# Patient Record
Sex: Female | Born: 1955 | Race: White | Hispanic: No | Smoking: Former smoker
Health system: Southern US, Community
[De-identification: ages and names within clinical notes are randomized; demographics above are authoritative.]

## PROBLEM LIST (undated history)

## (undated) DIAGNOSIS — F419 Anxiety disorder, unspecified: Secondary | ICD-10-CM

## (undated) DIAGNOSIS — I10 Essential (primary) hypertension: Secondary | ICD-10-CM

## (undated) HISTORY — PX: TONSILLECTOMY: SUR1361

## (undated) HISTORY — DX: Essential (primary) hypertension: I10

## (undated) HISTORY — DX: Anxiety disorder, unspecified: F41.9

---

## 2009-02-21 LAB — CBC AND DIFFERENTIAL
HCT: 42 (ref 36–46)
Hemoglobin: 15 (ref 12.0–16.0)
Platelets: 315 10*3/uL (ref 150–400)
WBC: 8.2

## 2009-02-21 LAB — CBC: RBC: 4.94 (ref 3.87–5.11)

## 2009-03-03 LAB — LIPID PANEL
Cholesterol: 197 (ref 0–200)
HDL: 4 — AB (ref 35–70)
LDL Cholesterol: 120
Triglycerides: 177 — AB (ref 40–160)

## 2019-12-26 ENCOUNTER — Telehealth: Payer: Self-pay | Admitting: *Deleted

## 2019-12-26 DIAGNOSIS — Z87891 Personal history of nicotine dependence: Secondary | ICD-10-CM

## 2019-12-26 LAB — BASIC METABOLIC PANEL
BUN: 22 — AB (ref 4–21)
CO2: 25 — AB (ref 13–22)
Chloride: 100 (ref 99–108)
Creatinine: 1 (ref 0.5–1.1)
Glucose: 84
Potassium: 3.3 mEq/L — AB (ref 3.5–5.1)
Sodium: 142 (ref 137–147)

## 2019-12-26 LAB — COMPREHENSIVE METABOLIC PANEL
Calcium: 9.5 (ref 8.7–10.7)
eGFR: 62

## 2019-12-26 LAB — HEMOGLOBIN A1C: Hemoglobin A1C: 5.7

## 2019-12-26 NOTE — Telephone Encounter (Signed)
Received referral for initial lung cancer screening scan. Contacted patient and obtained smoking history,(former, quit < 1 year ago, 35 pack year) as well as answering questions related to screening process. Patient denies signs of lung cancer such as weight loss or hemoptysis. Patient denies comorbidity that would prevent curative treatment if lung cancer were found. Patient is scheduled for shared decision making visit and CT scan on 01/02/20 at 145pm.

## 2020-01-02 ENCOUNTER — Encounter: Payer: Self-pay | Admitting: Oncology

## 2020-01-02 ENCOUNTER — Other Ambulatory Visit: Payer: Self-pay

## 2020-01-02 ENCOUNTER — Inpatient Hospital Stay: Payer: Medicare Other | Attending: Oncology | Admitting: Hospice and Palliative Medicine

## 2020-01-02 ENCOUNTER — Ambulatory Visit
Admission: RE | Admit: 2020-01-02 | Discharge: 2020-01-02 | Disposition: A | Discharge status: Home / Self Care | Payer: Medicare Other | Source: Ambulatory Visit | Attending: Oncology | Admitting: Oncology

## 2020-01-02 DIAGNOSIS — Z87891 Personal history of nicotine dependence: Secondary | ICD-10-CM | POA: Diagnosis present

## 2020-01-02 NOTE — Progress Notes (Signed)
In accordance with CMS guidelines, patient has met eligibility criteria including age, absence of signs or symptoms of lung cancer.  Social History   Tobacco Use  . Smoking status: Former Smoker    Packs/day: 1.00    Years: 35.00    Pack years: 35.00    Types: Cigarettes    Quit date: 11/08/2019    Years since quitting: 0.1  Substance Use Topics  . Alcohol use: Not on file  . Drug use: Not on file      A shared decision-making session was conducted prior to the performance of CT scan. This includes one or more decision aids, includes benefits and harms of screening, follow-up diagnostic testing, over-diagnosis, false positive rate, and total radiation exposure.   Counseling on the importance of adherence to annual lung cancer LDCT screening, impact of co-morbidities, and ability or willingness to undergo diagnosis and treatment is imperative for compliance of the program.   Counseling on the importance of continued smoking cessation for former smokers; the importance of smoking cessation for current smokers, and information about tobacco cessation interventions have been given to patient including [place] and 1800 quit [place] programs.   Written order for lung cancer screening with LDCT has been given to the patient and any and all questions have been answered to the best of my abilities.    Yearly follow up will be coordinated by Burgess Estelle, Thoracic Navigator.  Time Total: 15 minutes  Visit consisted of counseling and education dealing with complex health screening. Greater than 50%  of this time was spent counseling and coordinating care related to the above assessment and plan.  Signed by: Altha Harm, PhD, NP-C [phone] (Work Cell)

## 2020-01-03 ENCOUNTER — Telehealth: Payer: Self-pay | Admitting: *Deleted

## 2020-01-03 NOTE — Telephone Encounter (Signed)
Notified patient of LDCT lung cancer screening program results with recommendation for 12 month follow up imaging. Also notified of incidental findings noted below and is encouraged to discuss further with PCP who will receive a copy of this note and/or the CT report. Patient verbalizes understanding.   IMPRESSION: 1. Lung-RADS 1, negative. Continue annual screening with low-dose chest CT without contrast in 12 months. 2. 11 mm left adrenal nodule cannot be definitively characterized. 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 3. Cholelithiasis 4.  Emphysema. (ICD10-J43.9)

## 2020-12-11 ENCOUNTER — Telehealth: Payer: Self-pay | Admitting: *Deleted

## 2020-12-11 NOTE — Telephone Encounter (Signed)
Patient not ready to schedule please call back in a month.

## 2021-01-29 ENCOUNTER — Telehealth: Payer: Self-pay

## 2021-01-29 NOTE — Telephone Encounter (Signed)
Left message for patient to call back if she is ready to schedule lung cancer screening CT scan at [phone]

## 2021-03-24 ENCOUNTER — Telehealth: Payer: Self-pay | Admitting: *Deleted

## 2021-03-24 NOTE — Telephone Encounter (Signed)
Left another message for patient to notify them that it is time to schedule annual low dose lung cancer screening CT scan. Instructed patient to call back ([phone]) to verify information and schedule.  

## 2022-01-09 ENCOUNTER — Ambulatory Visit (INDEPENDENT_AMBULATORY_CARE_PROVIDER_SITE_OTHER): Payer: Medicare HMO | Admitting: Internal Medicine

## 2022-01-09 ENCOUNTER — Encounter: Payer: Self-pay | Admitting: Internal Medicine

## 2022-01-09 VITALS — BP 155/96 | HR 75 | Ht 65.0 in | Wt 159.0 lb

## 2022-01-09 DIAGNOSIS — E278 Other specified disorders of adrenal gland: Secondary | ICD-10-CM

## 2022-01-09 DIAGNOSIS — J3089 Other allergic rhinitis: Secondary | ICD-10-CM

## 2022-01-09 DIAGNOSIS — E279 Disorder of adrenal gland, unspecified: Secondary | ICD-10-CM

## 2022-01-09 DIAGNOSIS — I10 Essential (primary) hypertension: Secondary | ICD-10-CM

## 2022-01-09 DIAGNOSIS — F39 Unspecified mood [affective] disorder: Secondary | ICD-10-CM

## 2022-01-09 DIAGNOSIS — F17201 Nicotine dependence, unspecified, in remission: Secondary | ICD-10-CM | POA: Diagnosis not present

## 2022-01-09 DIAGNOSIS — N3281 Overactive bladder: Secondary | ICD-10-CM | POA: Insufficient documentation

## 2022-01-09 DIAGNOSIS — J432 Centrilobular emphysema: Secondary | ICD-10-CM

## 2022-01-09 MED ORDER — OXYBUTYNIN CHLORIDE ER 5 MG PO TB24: 5.0000 mg | ORAL_TABLET | Freq: Every day | ORAL | 1 refills | Status: DC

## 2022-01-09 MED ORDER — HYDROCHLOROTHIAZIDE 50 MG PO TABS: 50.0000 mg | ORAL_TABLET | Freq: Every day | ORAL | 1 refills | Status: DC

## 2022-01-09 NOTE — Progress Notes (Signed)
? ? ?Date:  01/09/2022  ? ?Name:  Taylor Khan   DOB:  03-31-1956   MRN:  [id number] ? ? ?Chief Complaint: Establish Care, Hypertension (Hasnt been on HCTZ for 1.5 years), and Emphysema (Has SOB, at night wheezing, rattling when breathing, had lung cancer screening 12/2019, allergies ) ? ?Hypertension ?This is a chronic problem. The problem is uncontrolled. Pertinent negatives include no chest pain, headaches or shortness of breath. Past treatments include diuretics (previously took HCTZ but none since 2020).  ?Cough ?This is a recurrent problem. The cough is Non-productive. Associated symptoms include myalgias (relieved by potassium) and postnasal drip. Pertinent negatives include no chest pain, chills, fever, headaches, rash, shortness of breath or wheezing. Risk factors: allergies with PND, rhinnorhea. Her past medical history is significant for emphysema and environmental allergies.  ? ?No results found for: NA, K, CO2, GLUCOSE, BUN, CREATININE, CALCIUM, EGFR, GFRNONAA, GLUCOSE ?No results found for: CHOL, HDL, LDLCALC, LDLDIRECT, TRIG, CHOLHDL ?No results found for: TSH ?No results found for: HGBA1C ?No results found for: WBC, HGB, HCT, MCV, PLT ?No results found for: ALT, AST, GGT, ALKPHOS, BILITOT ?No results found for: 25OHVITD2, [place], VD25OH  ? ?Review of Systems  ?Constitutional:  Negative for chills, fatigue, fever and unexpected weight change.  ?HENT:  Positive for postnasal drip.   ?Respiratory:  Positive for cough. Negative for chest tightness, shortness of breath and wheezing.   ?Cardiovascular:  Negative for chest pain and leg swelling.  ?Gastrointestinal:  Positive for constipation (relieved by Mg).  ?Genitourinary:  Positive for frequency and urgency.  ?Musculoskeletal:  Positive for myalgias (relieved by potassium).  ?Skin:  Negative for color change and rash.  ?Allergic/Immunologic: Positive for environmental allergies.  ?Neurological:  Negative for dizziness, light-headedness and headaches.   ?Psychiatric/Behavioral:  Positive for dysphoric mood. Negative for sleep disturbance. The patient is not nervous/anxious.   ? ?Patient Active Problem List  ? Diagnosis Date Noted  ? Essential hypertension 01/09/2022  ? Centrilobular emphysema ([place]) 01/09/2022  ? Adrenal nodule ([place]) 01/09/2022  ? Environmental and seasonal allergies 01/09/2022  ? OAB (overactive bladder) 01/09/2022  ? Tobacco use disorder, severe, in early remission 01/09/2022  ? ? ?Not on File ? ?Past Surgical History:  ?Procedure Laterality Date  ? TONSILLECTOMY    ? ? ?Social History  ? ?Tobacco Use  ? Smoking status: Former  ?  Packs/day: 1.00  ?  Years: 35.00  ?  Pack years: 35.00  ?  Types: Cigarettes  ?  Quit date: 11/08/2019  ?  Years since quitting: 2.1  ?Vaping Use  ? Vaping Use: Never used  ?Substance Use Topics  ? Alcohol use: Never  ? Drug use: Never  ? ? ? ?Medication list has been reviewed and updated. ? ?Current Meds  ?Medication Sig  ? Ascorbic Acid (VITAMIN C PO) Take by mouth.  ? hydrochlorothiazide (HYDRODIURIL) 50 MG tablet Take 1 tablet (50 mg total) by mouth daily.  ? MAGNESIUM PO Take by mouth.  ? oxybutynin (DITROPAN XL) 5 MG 24 hr tablet Take 1 tablet (5 mg total) by mouth at bedtime.  ? POTASSIUM PO Take by mouth.  ? VITAMIN D PO Take by mouth.  ? ? ? ?  01/09/2022  ?  2:58 PM  ?GAD 7 : Generalized Anxiety Score  ?Nervous, Anxious, on Edge 0  ?Control/stop worrying 0  ?Worry too much - different things 0  ?Trouble relaxing 0  ?Restless 0  ?Easily annoyed or irritable 0  ?Afraid - awful might  happen 0  ?Total GAD 7 Score 0  ? ? ? ?  01/09/2022  ?  2:57 PM  ?Depression screen PHQ 2/9  ?Decreased Interest 2  ?Down, Depressed, Hopeless 0  ?PHQ - 2 Score 2  ?Altered sleeping 3  ?Tired, decreased energy 3  ?Change in appetite 2  ?Feeling bad or failure about yourself  1  ?Trouble concentrating 0  ?Moving slowly or fidgety/restless 0  ?Suicidal thoughts 0  ?PHQ-9 Score 11  ?Difficult doing work/chores Somewhat difficult  ? ? ?BP  Readings from Last 3 Encounters:  ?01/09/22 (!) 155/96  ? ? ?Physical Exam ?Vitals and nursing note reviewed.  ?Constitutional:   ?   General: She is not in acute distress. ?   Appearance: Normal appearance. She is well-developed.  ?HENT:  ?   Head: Normocephalic and atraumatic.  ?Neck:  ?   Vascular: No carotid bruit.  ?Cardiovascular:  ?   Rate and Rhythm: Normal rate and regular rhythm.  ?Pulmonary:  ?   Effort: Pulmonary effort is normal. No respiratory distress.  ?   Breath sounds: No wheezing or rhonchi.  ?Musculoskeletal:  ?   Cervical back: Normal range of motion.  ?   Right lower leg: No edema.  ?   Left lower leg: No edema.  ?Lymphadenopathy:  ?   Cervical: No cervical adenopathy.  ?Skin: ?   General: Skin is warm and dry.  ?   Capillary Refill: Capillary refill takes less than 2 seconds.  ?   Findings: No rash.  ?Neurological:  ?   General: No focal deficit present.  ?   Mental Status: She is alert and oriented to person, place, and time.  ?Psychiatric:     ?   Mood and Affect: Mood normal.     ?   Behavior: Behavior normal.  ? ? ?Wt Readings from Last 3 Encounters:  ?01/09/22 159 lb (72.1 kg)  ?01/02/20 150 lb (68 kg)  ? ? ?BP (!) 155/96   Pulse 75   Ht 5' 5"  (1.651 m)   Wt 159 lb (72.1 kg)   SpO2 94%   BMI 26.46 kg/m?  ? ?Assessment and Plan: ?1. Essential hypertension ?Clinically stable exam with well controlled BP. ?Tolerating medications without side effects at this time. ?Pt to continue current regimen and low sodium diet; benefits of regular exercise as able discussed. ?- hydrochlorothiazide (HYDRODIURIL) 50 MG tablet; Take 1 tablet (50 mg total) by mouth daily.  Dispense: 90 tablet; Refill: 1 ? ?2. Centrilobular emphysema ([place]) ?Seen on CT - no ongoing shortness of breath, cough or wheeze ?She remains tobacco free ?- Ambulatory Referral Lung Cancer Screening [facility] ? ?3. Adrenal nodule ([place]) ?Seen on scan 2021 ?Needs MR to further characterize ?- MR Abdomen W Wo Contrast ? ?4.  Environmental and seasonal allergies ?Recommend Flonase NS ? ?5. OAB (overactive bladder) ?Significantly impacting her quality of life ?Will try Ditropan ?- oxybutynin (DITROPAN XL) 5 MG 24 hr tablet; Take 1 tablet (5 mg total) by mouth at bedtime.  Dispense: 90 tablet; Refill: 1 ? ?6. Tobacco use disorder, severe, in early remission ?Remains tobacco free ? ?7. Mood disorder ([place]) ?She does not feel that she is impaired - she has taken medications in the past but does not want them at this time. ?She has improved since her mother's passing as she is no longer stressed with her care ?Will discuss again at next visit and review symptom course ? ?Will request immunization and lab records  from Dr. Hall Busing. ?Partially dictated using Editor, commissioning. Any errors are unintentional. ? ?Halina Maidens, MD ?[facility] ?[facility] ? ?01/09/2022 ? ? ? ? ? ?

## 2022-01-21 ENCOUNTER — Ambulatory Visit: Payer: Medicare HMO

## 2022-01-26 ENCOUNTER — Ambulatory Visit
Admission: RE | Admit: 2022-01-26 | Discharge: 2022-01-26 | Disposition: A | Discharge status: Home / Self Care | Payer: Medicare HMO | Source: Ambulatory Visit | Attending: Internal Medicine | Admitting: Internal Medicine

## 2022-01-26 DIAGNOSIS — K449 Diaphragmatic hernia without obstruction or gangrene: Secondary | ICD-10-CM | POA: Diagnosis not present

## 2022-01-26 DIAGNOSIS — E279 Disorder of adrenal gland, unspecified: Secondary | ICD-10-CM | POA: Diagnosis not present

## 2022-01-26 DIAGNOSIS — E278 Other specified disorders of adrenal gland: Secondary | ICD-10-CM | POA: Insufficient documentation

## 2022-01-26 DIAGNOSIS — K8020 Calculus of gallbladder without cholecystitis without obstruction: Secondary | ICD-10-CM | POA: Diagnosis not present

## 2022-01-26 MED ORDER — GADOBUTROL 1 MMOL/ML IV SOLN
7.0000 mL | Freq: Once | INTRAVENOUS | Status: AC | PRN
  Administered 2022-01-26: 7 mL via INTRAVENOUS

## 2022-02-10 ENCOUNTER — Ambulatory Visit (INDEPENDENT_AMBULATORY_CARE_PROVIDER_SITE_OTHER): Payer: Medicare HMO | Admitting: Internal Medicine

## 2022-02-10 ENCOUNTER — Encounter: Payer: Self-pay | Admitting: Internal Medicine

## 2022-02-10 VITALS — BP 132/80 | HR 76 | Ht 65.0 in | Wt 153.0 lb

## 2022-02-10 DIAGNOSIS — I10 Essential (primary) hypertension: Secondary | ICD-10-CM | POA: Diagnosis not present

## 2022-02-10 DIAGNOSIS — F17201 Nicotine dependence, unspecified, in remission: Secondary | ICD-10-CM

## 2022-02-10 DIAGNOSIS — E782 Mixed hyperlipidemia: Secondary | ICD-10-CM | POA: Diagnosis not present

## 2022-02-10 DIAGNOSIS — E278 Other specified disorders of adrenal gland: Secondary | ICD-10-CM

## 2022-02-10 DIAGNOSIS — J432 Centrilobular emphysema: Secondary | ICD-10-CM

## 2022-02-10 DIAGNOSIS — N3281 Overactive bladder: Secondary | ICD-10-CM | POA: Diagnosis not present

## 2022-02-10 NOTE — Progress Notes (Signed)
Date:  02/10/2022   Name:  Taylor Khan   DOB:  [birth year]   MRN:  [id number]   Chief Complaint: Hypertension  Hypertension This is a chronic problem. The current episode started more than 1 year ago. The problem has been gradually improving since onset. The problem is controlled. Pertinent negatives include no chest pain, headaches, palpitations or shortness of breath. Past treatments include diuretics. The current treatment provides significant improvement.  OAB - much improved with Ditropan and no side effects.  Lab Results  Component Value Date   NA 142 12/26/2019   K 3.3 (A) 12/26/2019   CO2 25 (A) 12/26/2019   BUN 22 (A) 12/26/2019   CREATININE 1.0 12/26/2019   CALCIUM 9.5 12/26/2019   EGFR 62 12/26/2019   Lab Results  Component Value Date   CHOL 197 03/03/2009   HDL 4 (A) 03/03/2009   LDLCALC 120 03/03/2009   TRIG 177 (A) 03/03/2009   No results found for: TSH Lab Results  Component Value Date   HGBA1C 5.7 12/26/2019   Lab Results  Component Value Date   WBC 8.2 02/21/2009   HGB 15.0 02/21/2009   HCT 42 02/21/2009   PLT 315 02/21/2009   No results found for: ALT, AST, GGT, ALKPHOS, BILITOT No results found for: 25OHVITD2, 25OHVITD3, VD25OH   Review of Systems  Constitutional:  Negative for chills, fatigue and fever.  HENT:  Positive for postnasal drip. Negative for trouble swallowing.   Eyes:  Negative for visual disturbance.  Respiratory:  Positive for cough. Negative for chest tightness, shortness of breath and wheezing.   Cardiovascular:  Negative for chest pain, palpitations and leg swelling.  Musculoskeletal:  Negative for arthralgias and gait problem.  Skin:  Negative for rash.  Allergic/Immunologic: Positive for environmental allergies.  Neurological:  Negative for dizziness, light-headedness and headaches.  Psychiatric/Behavioral:  Negative for dysphoric mood and sleep disturbance. The patient is not nervous/anxious.    Patient Active Problem  List   Diagnosis Date Noted   Essential hypertension 01/09/2022   Centrilobular emphysema ([place]) 01/09/2022   Adrenal nodule ([place]) 01/09/2022   Environmental and seasonal allergies 01/09/2022   OAB (overactive bladder) 01/09/2022   Tobacco use disorder, severe, in early remission 01/09/2022    Not on File  Past Surgical History:  Procedure Laterality Date   TONSILLECTOMY      Social History   Tobacco Use   Smoking status: Former    Packs/day: 1.00    Years: 35.00    Pack years: 35.00    Types: Cigarettes    Quit date: 11/08/2019    Years since quitting: 2.2  Vaping Use   Vaping Use: Never used  Substance Use Topics   Alcohol use: Never   Drug use: Never     Medication list has been reviewed and updated.  Current Meds  Medication Sig   Ascorbic Acid (VITAMIN C PO) Take by mouth.   hydrochlorothiazide (HYDRODIURIL) 50 MG tablet Take 1 tablet (50 mg total) by mouth daily.   MAGNESIUM PO Take by mouth.   oxybutynin (DITROPAN XL) 5 MG 24 hr tablet Take 1 tablet (5 mg total) by mouth at bedtime.   POTASSIUM PO Take by mouth.   VITAMIN D PO Take by mouth.       02/10/2022    2:46 PM 01/09/2022    2:58 PM  GAD 7 : Generalized Anxiety Score  Nervous, Anxious, on Edge 0 0  Control/stop worrying 0 0  Worry too  much - different things 0 0  Trouble relaxing 0 0  Restless 0 0  Easily annoyed or irritable 0 0  Afraid - awful might happen 0 0  Total GAD 7 Score 0 0  Anxiety Difficulty Not difficult at all        02/10/2022    2:46 PM  Depression screen PHQ 2/9  Decreased Interest 2  Down, Depressed, Hopeless 0  PHQ - 2 Score 2  Altered sleeping 1  Tired, decreased energy 1  Change in appetite 1  Feeling bad or failure about yourself  1  Trouble concentrating 2  Moving slowly or fidgety/restless 0  Suicidal thoughts 0  PHQ-9 Score 8  Difficult doing work/chores Not difficult at all    BP Readings from Last 3 Encounters:  02/10/22 132/80  01/09/22 (!)  155/96    Physical Exam Vitals and nursing note reviewed.  Constitutional:      General: She is not in acute distress.    Appearance: Normal appearance. She is well-developed.  HENT:     Head: Normocephalic and atraumatic.  Neck:     Vascular: No carotid bruit.  Cardiovascular:     Rate and Rhythm: Normal rate and regular rhythm.     Pulses: Normal pulses.     Heart sounds: No murmur heard. Pulmonary:     Effort: Pulmonary effort is normal. No respiratory distress.     Breath sounds: No wheezing or rhonchi.  Musculoskeletal:     Cervical back: Normal range of motion.     Right lower leg: No edema.     Left lower leg: No edema.  Lymphadenopathy:     Cervical: No cervical adenopathy.  Skin:    General: Skin is warm and dry.     Findings: No rash.  Neurological:     General: No focal deficit present.     Mental Status: She is alert and oriented to person, place, and time.  Psychiatric:        Mood and Affect: Mood normal.        Behavior: Behavior normal.    Wt Readings from Last 3 Encounters:  02/10/22 153 lb (69.4 kg)  01/09/22 159 lb (72.1 kg)  01/02/20 150 lb (68 kg)    BP 132/80   Pulse 76   Ht 5' 5"  (1.651 m)   Wt 153 lb (69.4 kg)   SpO2 94%   BMI 25.46 kg/m   Assessment and Plan: 1. Essential hypertension Clinically stable exam with well controlled BP since adding HCTZ Tolerating medications without side effects at this time. Pt to continue current regimen and low sodium diet; benefits of regular exercise as able discussed. Follow up in 6 months - CBC with Differential/Platelet - Comprehensive metabolic panel - TSH  2. Centrilobular emphysema ([place]) Referred to [place] for CT but has not been contacted Will send new referral.  3. Adrenal nodule ([place]) MRI showed a stable, benign nodule - no further follow up is needed  4. OAB (overactive bladder) Doing well Ditropan  5. Mixed hyperlipidemia Check labs. - Lipid panel   Partially dictated  using Editor, commissioning. Any errors are unintentional.  Halina Maidens, MD [facility]  02/10/2022

## 2022-02-11 ENCOUNTER — Other Ambulatory Visit: Payer: Self-pay | Admitting: Internal Medicine

## 2022-02-11 DIAGNOSIS — E876 Hypokalemia: Secondary | ICD-10-CM

## 2022-02-11 LAB — COMPREHENSIVE METABOLIC PANEL
ALT: 17 IU/L (ref 0–32)
AST: 23 IU/L (ref 0–40)
Albumin/Globulin Ratio: 1.8 (ref 1.2–2.2)
Albumin: 4.8 g/dL (ref 3.8–4.8)
Alkaline Phosphatase: 136 IU/L — ABNORMAL HIGH (ref 44–121)
BUN/Creatinine Ratio: 25 (ref 12–28)
BUN: 24 mg/dL (ref 8–27)
Bilirubin Total: 0.3 mg/dL (ref 0.0–1.2)
CO2: 29 mmol/L (ref 20–29)
Calcium: 9.9 mg/dL (ref 8.7–10.3)
Chloride: 93 mmol/L — ABNORMAL LOW (ref 96–106)
Creatinine, Ser: 0.96 mg/dL (ref 0.57–1.00)
Globulin, Total: 2.7 g/dL (ref 1.5–4.5)
Glucose: 92 mg/dL (ref 70–99)
Potassium: 2.8 mmol/L — ABNORMAL LOW (ref 3.5–5.2)
Sodium: 140 mmol/L (ref 134–144)
Total Protein: 7.5 g/dL (ref 6.0–8.5)
eGFR: 65 mL/min/{1.73_m2} (ref >59)

## 2022-02-11 LAB — LIPID PANEL
Chol/HDL Ratio: 3.4 ratio (ref 0.0–4.4)
Cholesterol, Total: 208 mg/dL — ABNORMAL HIGH (ref 100–199)
HDL: 62 mg/dL (ref >39)
LDL Chol Calc (NIH): 100 mg/dL — ABNORMAL HIGH (ref 0–99)
Triglycerides: 276 mg/dL — ABNORMAL HIGH (ref 0–149)
VLDL Cholesterol Cal: 46 mg/dL — ABNORMAL HIGH (ref 5–40)

## 2022-02-11 LAB — CBC WITH DIFFERENTIAL/PLATELET
Basophils Absolute: 0.1 10*3/uL (ref 0.0–0.2)
Basos: 1 %
EOS (ABSOLUTE): 0.3 10*3/uL (ref 0.0–0.4)
Eos: 4 %
Hematocrit: 46.2 % (ref 34.0–46.6)
Hemoglobin: 15.8 g/dL (ref 11.1–15.9)
Immature Grans (Abs): 0 10*3/uL (ref 0.0–0.1)
Immature Granulocytes: 0 %
Lymphocytes Absolute: 2.6 10*3/uL (ref 0.7–3.1)
Lymphs: 33 %
MCH: 28.7 pg (ref 26.6–33.0)
MCHC: 34.2 g/dL (ref 31.5–35.7)
MCV: 84 fL (ref 79–97)
Monocytes Absolute: 0.6 10*3/uL (ref 0.1–0.9)
Monocytes: 8 %
Neutrophils Absolute: 4.4 10*3/uL (ref 1.4–7.0)
Neutrophils: 54 %
Platelets: 360 10*3/uL (ref 150–450)
RBC: 5.5 x10E6/uL — ABNORMAL HIGH (ref 3.77–5.28)
RDW: 13.6 % (ref 11.7–15.4)
WBC: 7.9 10*3/uL (ref 3.4–10.8)

## 2022-02-11 LAB — TSH: TSH: 2.75 u[IU]/mL (ref 0.450–4.500)

## 2022-02-11 MED ORDER — POTASSIUM CHLORIDE CRYS ER 20 MEQ PO TBCR: 20.0000 meq | EXTENDED_RELEASE_TABLET | Freq: Every day | ORAL | 3 refills | Status: DC

## 2022-02-12 ENCOUNTER — Telehealth: Payer: Self-pay | Admitting: Acute Care

## 2022-02-12 NOTE — Telephone Encounter (Signed)
Spoke with patient to schedule 2023 LDCT.  Patient has to have family member drive her.  She will call back to schedule after she talks with her daughter.  Given call back number

## 2022-03-31 ENCOUNTER — Telehealth: Payer: Self-pay | Admitting: Internal Medicine

## 2022-03-31 NOTE — Telephone Encounter (Signed)
Copied from CRM [id number]. Topic: Medicare AWV >> Mar 31, 2022 11:09 AM Zannie Kehr wrote: Reason for CRM: Left message for patient to call back and schedule Medicare Annual Wellness Visit (AWV) in office.   If unable to come into the office for AWV,  please offer to do virtually or by telephone.  No hx of AWV eligible for AWVI per [facility] as of 04/14/2020  Please schedule at anytime with [facility] Advisor.      45 minute appointment   Any questions, please call me at [phone]

## 2022-04-08 ENCOUNTER — Ambulatory Visit (INDEPENDENT_AMBULATORY_CARE_PROVIDER_SITE_OTHER): Payer: Medicare HMO

## 2022-04-08 DIAGNOSIS — Z0000 Encounter for general adult medical examination without abnormal findings: Secondary | ICD-10-CM | POA: Diagnosis not present

## 2022-04-08 NOTE — Progress Notes (Signed)
I connected with  Taylor Khan on 04/08/22 by a audio enabled telemedicine application and verified that I am speaking with the correct person using two identifiers.  Patient Location: Home  Provider Location: Home Office  I discussed the limitations of evaluation and management by telemedicine. The patient expressed understanding and agreed to proceed.   Subjective:   Taylor Khan is a 66 y.o. female who presents for Medicare Annual (Subsequent) preventive examination.  Review of Systems    Per HPI unless specifically indicated below  Cardiac Risk Factors include: advanced age (>86men, >41 women);female gender          Objective:    Today's Vitals   04/08/22 1357  PainSc: 0-No pain   There is no height or weight on file to calculate BMI.      No data to display          Current Medications (verified) Outpatient Encounter Medications as of 04/08/2022  Medication Sig   hydrochlorothiazide (HYDRODIURIL) 50 MG tablet Take 1 tablet (50 mg total) by mouth daily.   MAGNESIUM PO Take by mouth.   oxybutynin (DITROPAN XL) 5 MG 24 hr tablet Take 1 tablet (5 mg total) by mouth at bedtime.   potassium chloride SA (KLOR-CON M) 20 MEQ tablet Take 1 tablet (20 mEq total) by mouth daily.   triamcinolone (NASACORT ALLERGY 24HR) 55 MCG/ACT AERO nasal inhaler Place 2 sprays into the nose daily.   VITAMIN D PO Take by mouth.   Ascorbic Acid (VITAMIN C PO) Take by mouth. (Patient not taking: Reported on 04/08/2022)   No facility-administered encounter medications on file as of 04/08/2022.    Allergies (verified) Patient has no known allergies.   History: Past Medical History:  Diagnosis Date   Anxiety    Hypertension    Past Surgical History:  Procedure Laterality Date   TONSILLECTOMY     Family History  Problem Relation Age of Onset   Hypertension Mother    Heart disease Mother    Stroke Father    Heart disease Father    Hypertension Father    Diabetes Paternal  Grandfather    Social History   Socioeconomic History   Marital status: Single    Spouse name: Not on file   Number of children: 1   Years of education: Not on file   Highest education level: Not on file  Occupational History   Occupation: Retired  Tobacco Use   Smoking status: Former    Packs/day: 1.00    Years: 35.00    Total pack years: 35.00    Types: Cigarettes    Quit date: 11/08/2019    Years since quitting: 2.4   Smokeless tobacco: Not on file  Vaping Use   Vaping Use: Never used  Substance and Sexual Activity   Alcohol use: Never   Drug use: Never   Sexual activity: Not on file  Other Topics Concern   Not on file  Social History Narrative   Not on file   Social Determinants of Health   Financial Resource Strain: High Risk (04/08/2022)   Overall Financial Resource Strain (CARDIA)    Difficulty of Paying Living Expenses: Very hard  Food Insecurity: No Food Insecurity (04/08/2022)   Hunger Vital Sign    Worried About Running Out of Food in the Last Year: Never true    Ran Out of Food in the Last Year: Never true  Transportation Needs: No Transportation Needs (04/08/2022)   PRAPARE - Transportation  Lack of Transportation (Medical): No    Lack of Transportation (Non-Medical): No  Physical Activity: Not on file  Stress: No Stress Concern Present (04/08/2022)   [employer] of Occupational Health - Occupational Stress Questionnaire    Feeling of Stress : Not at all  Social Connections: Socially Isolated (04/08/2022)   Social Connection and Isolation Panel [NHANES]    Frequency of Communication with Friends and Family: More than three times a week    Frequency of Social Gatherings with Friends and Family: More than three times a week    Attends Religious Services: Never    Database administrator or Organizations: No    Attends Banker Meetings: Never    Marital Status: Never married    Tobacco Counseling Former smoker, quit smoking  11/08/2019   Clinical Intake:  Pre-visit preparation completed: No  Pain : 0-10 Pain Score: 0-No pain     Nutritional Status: BMI 25 -29 Overweight Nutritional Risks: None Diabetes: No  How often do you need to have someone help you when you read instructions, pamphlets, or other written materials from your doctor or pharmacy?: 1 - Never  Diabetic?No     Information entered by :: Laurel Dimmer, CMA   Activities of Daily Living    04/08/2022    1:50 PM 01/09/2022    2:58 PM  In your present state of health, do you have any difficulty performing the following activities:  Hearing? 0 1  Vision? 1 1  Comment last eye exam 2 yrs   Difficulty concentrating or making decisions? 0 1  Walking or climbing stairs? 1 1  Dressing or bathing? 0 0  Doing errands, shopping? 1 1    Patient Care Team: Reubin Milan, MD as PCP - General (Internal Medicine)  Indicate any recent Medical Services you may have received from other than [facility] providers in the past year (date may be approximate). No hospitalization in the past 12 months     Assessment:   This is a routine wellness examination for Taylor Khan.  Hearing/Vision screen Denies any hearing loss. Biannual eye exam - [facility] , recommended annual eye exams  Dietary issues and exercise activities discussed: Current Exercise Habits: The patient does not participate in regular exercise at present, Exercise limited by: Other - see comments   Goals Addressed   None    Depression Screen    04/08/2022    1:52 PM 02/10/2022    2:46 PM 01/09/2022    2:57 PM  PHQ 2/9 Scores  PHQ - 2 Score 2 2 2   PHQ- 9 Score 9 8 11     Fall Risk    04/08/2022    1:49 PM 02/10/2022    2:47 PM 01/09/2022    2:58 PM  Fall Risk   Falls in the past year? 0 0 0  Number falls in past yr: 0 0 0  Injury with Fall? 0 0 0  Risk for fall due to : No Fall Risks No Fall Risks No Fall Risks  Follow up Falls evaluation completed Falls evaluation completed  Falls evaluation completed    FALL RISK PREVENTION PERTAINING TO THE HOME:  Any stairs in or around the home? No  If so, are there any without handrails? No Home free of loose throw rugs in walkways, pet beds, electrical cords, etc? Yes  Adequate lighting in your home to reduce risk of falls? Yes   ASSISTIVE DEVICES UTILIZED TO PREVENT FALLS:  Life alert? No  Use  of a cane, walker or w/c? No  Grab bars in the bathroom? No  Shower chair or bench in shower? No  Elevated toilet seat or a handicapped toilet? No   TIMED UP AND GO:  Was the test performed? No .  Unable to perform because the appt was virtual.  Cognitive Function:        04/08/2022    1:55 PM  6CIT Screen  What Year? 0 points  What month? 0 points  What time? 0 points  Count back from 20 0 points  Months in reverse 0 points  Repeat phrase 0 points  Total Score 0 points    Immunizations  There is no immunization history on file for this patient.  TDAP status: Due, Education has been provided regarding the importance of this vaccine. Advised may receive this vaccine at local pharmacy or Health Dept. Aware to provide a copy of the vaccination record if obtained from local pharmacy or Health Dept. Verbalized acceptance and understanding.  Flu Vaccine status: Up to date  Pneumococcal vaccine status: Due, Education has been provided regarding the importance of this vaccine. Advised may receive this vaccine at local pharmacy or Health Dept. Aware to provide a copy of the vaccination record if obtained from local pharmacy or Health Dept. Verbalized acceptance and understanding.( The pt informed me that she received this vaccine at [facility]. I contacted [facility] and no vaccines was given to the the patient.  Covid-19 vaccine status: Declined, Education has been provided regarding the importance of this vaccine but patient still declined. Advised may receive this vaccine at local pharmacy or Health Dept.or  vaccine clinic. Aware to provide a copy of the vaccination record if obtained from local pharmacy or Health Dept. Verbalized acceptance and understanding.  Qualifies for Shingles Vaccine? Yes   Zostavax completed No   Shingrix Completed?: No.    Education has been provided regarding the importance of this vaccine. Patient has been advised to call insurance company to determine out of pocket expense if they have not yet received this vaccine. Advised may also receive vaccine at local pharmacy or Health Dept. Verbalized acceptance and understanding. ( The pt informed me that she received this vaccine at [facility]. I contacted [facility] and no vaccines was given to the the patient. I also checked [facility], no vaccines documented.)  Screening Tests Health Maintenance  Topic Date Due   COVID-19 Vaccine (1) Never done   Hepatitis C Screening  Never done   TETANUS/TDAP  Never done   Zoster Vaccines- Shingrix (1 of 2) Never done   Pneumonia Vaccine 85+ Years old (1 - PCV) 02/11/2023 (Originally 04/15/1962)   MAMMOGRAM  02/11/2023 (Originally 04/15/2004)   DEXA SCAN  02/11/2023 (Originally 04/16/2019)   COLONOSCOPY (Pts 45-28yrs Insurance coverage will need to be confirmed)  02/11/2023 (Originally 04/16/1999)   INFLUENZA VACCINE  04/14/2022   HPV VACCINES  Aged Out    Health Maintenance  Health Maintenance Due  Topic Date Due   COVID-19 Vaccine (1) Never done   Hepatitis C Screening  Never done   TETANUS/TDAP  Never done   Zoster Vaccines- Shingrix (1 of 2) Never done      Colorectal Exam: The pt declined    Mammogram: The pt declined   Dexa Scan: The pt declined   Lung Cancer Screening: (Low Dose CT Chest recommended if Age 52-80 years, 30 pack-year currently smoking OR have quit w/in 15years.) does not qualify.   Lung Cancer Screening Referral:  does not qualify  Additional Screening:  Hepatitis C Screening: does qualify; Postpone   Vision Screening: Recommended annual  ophthalmology exams for early detection of glaucoma and other disorders of the eye. Is the patient up to date with their annual eye exam?  No  Who is the provider or what is the name of the office in which the patient attends annual eye exams?  [facility] If pt is not established with a provider, would they like to be referred to a provider to establish care? No .   Dental Screening: Recommended annual dental exams for proper oral hygiene  Community Resource Referral / Chronic Care Management: CRR required this visit?  No   CCM required this visit?  No      Plan:     I have personally reviewed and noted the following in the patient's chart:   Medical and social history Use of alcohol, tobacco or illicit drugs  Current medications and supplements including opioid prescriptions.  Functional ability and status Nutritional status Physical activity Advanced directives List of other physicians Hospitalizations, surgeries, and ER visits in previous 12 months Vitals Screenings to include cognitive, depression, and falls Referrals and appointments  In addition, I have reviewed and discussed with patient certain preventive protocols, quality metrics, and best practice recommendations. A written personalized care plan for preventive services as well as general preventive health recommendations were provided to patient.     Ms. Apperson , Thank you for taking time to come for your Medicare Wellness Visit. I appreciate your ongoing commitment to your health goals. Please review the following plan we discussed and let me know if I can assist you in the future.   These are the goals we discussed:  Goals   None     This is a list of the screening recommended for you and due dates:  Health Maintenance  Topic Date Due   COVID-19 Vaccine (1) Never done   Hepatitis C Screening: USPSTF Recommendation to screen - Ages 22-79 yo.  Never done   Tetanus Vaccine  Never done   Zoster (Shingles)  Vaccine (1 of 2) Never done   Pneumonia Vaccine (1 - PCV) 02/11/2023*   Mammogram  02/11/2023*   DEXA scan (bone density measurement)  02/11/2023*   Colon Cancer Screening  02/11/2023*   Flu Shot  04/14/2022   HPV Vaccine  Aged Out  *Topic was postponed. The date shown is not the original due date.     Lonna Cobb, CMA   04/08/2022   Nurse Notes: Non-face-to-face AWV

## 2022-04-08 NOTE — Patient Instructions (Signed)
Bone Density Test A bone density test uses a type of X-ray to measure the amount of calcium and other minerals in a person's bones. It can measure bone density in the hip and the spine. The test is similar to having a regular X-ray. This test may also be called: Bone densitometry. Bone mineral density test. Dual-energy X-ray absorptiometry (DEXA). You may have this test to: Diagnose a condition that causes weak or thin bones (osteoporosis). Screen you for osteoporosis. Predict your risk for a broken bone (fracture). Determine how well your osteoporosis treatment is working. Tell a health care provider about: Any allergies you have. All medicines you are taking, including vitamins, herbs, eye drops, creams, and over-the-counter medicines. Any problems you or family members have had with anesthetic medicines. Any blood disorders you have. Any surgeries you have had. Any medical conditions you have. Whether you are pregnant or may be pregnant. Any medical tests you have had within the past 14 days that used contrast material. What are the risks? Generally, this is a safe test. However, it does expose you to a small amount of radiation, which can slightly increase your cancer risk. What happens before the test? Do not take any calcium supplements within the 24 hours before your test. You will need to remove all metal jewelry, eyeglasses, removable dental appliances, and any other metal objects on your body. What happens during the test?  You will lie down on an exam table. There will be an X-ray generator below you and an imaging device above you. Other devices, such as boxes or braces, may be used to position your body properly for the scan. The machine will slowly scan your body. You will need to keep very still while the machine does the scan. The images will show up on a screen in the room. Images will be examined by a specialist after your test is finished. The procedure may vary among  health care providers and hospitals. What can I expect after the test? It is up to you to get the results of your test. Ask your health care provider, or the department that is doing the test, when your results will be ready. Summary A bone density test is an imaging test that uses a type of X-ray to measure the amount of calcium and other minerals in your bones. The test may be used to diagnose or screen you for a condition that causes weak or thin bones (osteoporosis), predict your risk for a broken bone (fracture), or determine how well your osteoporosis treatment is working. Do not take any calcium supplements within 24 hours before your test. Ask your health care provider, or the department that is doing the test, when your results will be ready. This information is not intended to replace advice given to you by your health care provider. Make sure you discuss any questions you have with your health care provider. Document Revised: 05/14/2021 Document Reviewed: 02/15/2020 Elsevier Patient Education  [place].  Colonoscopy, Adult A colonoscopy is a procedure to look at the entire large intestine. This procedure is done using a long, thin, flexible tube that has a camera on the end. You may have a colonoscopy: As a part of normal colorectal screening. If you have certain symptoms, such as: A low number of red blood cells in your blood (anemia). Diarrhea that does not go away. Pain in your abdomen. Blood in your stool. A colonoscopy can help screen for and diagnose medical problems, including: An abnormal  growth of cells or tissue (tumor). Abnormal growths within the lining of your intestine (polyps). Inflammation. Areas of bleeding. Tell your health care provider about: Any allergies you have. All medicines you are taking, including vitamins, herbs, eye drops, creams, and over-the-counter medicines. Any problems you or family members have had with anesthetic medicines. Any  bleeding problems you have. Any surgeries you have had. Any medical conditions you have. Any problems you have had with having bowel movements. Whether you are pregnant or may be pregnant. What are the risks? Generally, this is a safe procedure. However, problems may occur, including: Bleeding. Damage to your intestine. Allergic reactions to medicines given during the procedure. Infection. This is rare. What happens before the procedure? Eating and drinking restrictions Follow instructions from your health care provider about eating or drinking restrictions, which may include: A few days before the procedure: Follow a low-fiber diet. Avoid nuts, seeds, dried fruit, raw fruits, and vegetables. 1-3 days before the procedure: Eat only gelatin dessert or ice pops. Drink only clear liquids, such as water, clear juice, clear broth or bouillon, black coffee or tea, or clear soft drinks or sports drinks. Avoid liquids that contain red or purple dye. The day of the procedure: Do not eat solid foods. You may continue to drink clear liquids until up to 2 hours before the procedure. Do not eat or drink anything starting 2 hours before the procedure, or within the time period that your health care provider recommends. Bowel prep If you were prescribed a bowel prep to take by mouth (orally) to clean out your colon: Take it as told by your health care provider. Starting the day before your procedure, you will need to drink a large amount of liquid medicine. The liquid will cause you to have many bowel movements of loose stool until your stool becomes almost clear or light green. If your skin or the opening between the buttocks (anus) gets irritated from diarrhea, you may relieve the irritation using: Wipes with medicine in them, such as adult wet wipes with aloe and vitamin E. A product to soothe skin, such as petroleum jelly. If you vomit while drinking the bowel prep: Take a break for up to 60  minutes. Begin the bowel prep again. Call your health care provider if you keep vomiting or you cannot take the bowel prep without vomiting. To clean out your colon, you may also be given: Laxative medicines. These help you have a bowel movement. Instructions for enema use. An enema is liquid medicine injected into your rectum. Medicines Ask your health care provider about: Changing or stopping your regular medicines or supplements. This is especially important if you are taking iron supplements, diabetes medicines, or blood thinners. Taking medicines such as aspirin and ibuprofen. These medicines can thin your blood. Do not take these medicines unless your health care provider tells you to take them. Taking over-the-counter medicines, vitamins, herbs, and supplements. General instructions Ask your health care provider what steps will be taken to help prevent infection. These may include washing skin with a germ-killing soap. If you will be going home right after the procedure, plan to have a responsible adult: Take you home from the hospital or clinic. You will not be allowed to drive. Care for you for the time you are told. What happens during the procedure?  An IV will be inserted into one of your veins. You will be given a medicine to make you fall asleep (general anesthetic). You will lie on  your side with your knees bent. A lubricant will be put on the tube. Then the tube will be: Inserted into your anus. Gently eased through all parts of your large intestine. Air will be sent into your colon to keep it open. This may cause some pressure or cramping. Images will be taken with the camera and will appear on a screen. A small tissue sample may be removed to be looked at under a microscope (biopsy). The tissue may be sent to a lab for testing if any signs of problems are found. If small polyps are found, they may be removed and checked for cancer cells. When the procedure is finished,  the tube will be removed. The procedure may vary among health care providers and hospitals. What happens after the procedure? Your blood pressure, heart rate, breathing rate, and blood oxygen level will be monitored until you leave the hospital or clinic. You may have a small amount of blood in your stool. You may pass gas and have mild cramping or bloating in your abdomen. This is caused by the air that was used to open your colon during the exam. If you were given a sedative during the procedure, it can affect you for several hours. Do not drive or operate machinery until your health care provider says that it is safe. It is up to you to get the results of your procedure. Ask your health care provider, or the department that is doing the procedure, when your results will be ready. Summary A colonoscopy is a procedure to look at the entire large intestine. Follow instructions from your health care provider about eating and drinking before the procedure. If you were prescribed an oral bowel prep to clean out your colon, take it as told by your health care provider. During the colonoscopy, a flexible tube with a camera on its end is inserted into the anus and then passed into all parts of the large intestine. This information is not intended to replace advice given to you by your health care provider. Make sure you discuss any questions you have with your health care provider. Document Revised: 08/25/2021 Document Reviewed: 04/23/2021 Elsevier Patient Education  2023 [employer].  Fall Prevention in the Home, Adult Falls can cause injuries and can happen to people of all ages. There are many things you can do to make your home safe and to help prevent falls. Ask for help when making these changes. What actions can I take to prevent falls? General Instructions Use good lighting in all rooms. Replace any light bulbs that burn out. Turn on the lights in dark areas. Use night-lights. Keep items  that you use often in easy-to-reach places. Lower the shelves around your home if needed. Set up your furniture so you have a clear path. Avoid moving your furniture around. Do not have throw rugs or other things on the floor that can make you trip. Avoid walking on wet floors. If any of your floors are uneven, fix them. Add color or contrast paint or tape to clearly mark and help you see: Grab bars or handrails. First and last steps of staircases. Where the edge of each step is. If you use a stepladder: Make sure that it is fully opened. Do not climb a closed stepladder. Make sure the sides of the stepladder are locked in place. Ask someone to hold the stepladder while you use it. Know where your pets are when moving through your home. What can I do in  the bathroom?     Keep the floor dry. Clean up any water on the floor right away. Remove soap buildup in the tub or shower. Use nonskid mats or decals on the floor of the tub or shower. Attach bath mats securely with double-sided, nonslip rug tape. If you need to sit down in the shower, use a plastic, nonslip stool. Install grab bars by the toilet and in the tub and shower. Do not use towel bars as grab bars. What can I do in the bedroom? Make sure that you have a light by your bed that is easy to reach. Do not use any sheets or blankets for your bed that hang to the floor. Have a firm chair with side arms that you can use for support when you get dressed. What can I do in the kitchen? Clean up any spills right away. If you need to reach something above you, use a step stool with a grab bar. Keep electrical cords out of the way. Do not use floor polish or wax that makes floors slippery. What can I do with my stairs? Do not leave any items on the stairs. Make sure that you have a light switch at the top and the bottom of the stairs. Make sure that there are handrails on both sides of the stairs. Fix handrails that are broken or  loose. Install nonslip stair treads on all your stairs. Avoid having throw rugs at the top or bottom of the stairs. Choose a carpet that does not hide the edge of the steps on the stairs. Check carpeting to make sure that it is firmly attached to the stairs. Fix carpet that is loose or worn. What can I do on the outside of my home? Use bright outdoor lighting. Fix the edges of walkways and driveways and fix any cracks. Remove anything that might make you trip as you walk through a door, such as a raised step or threshold. Trim any bushes or trees on paths to your home. Check to see if handrails are loose or broken and that both sides of all steps have handrails. Install guardrails along the edges of any raised decks and porches. Clear paths of anything that can make you trip, such as tools or rocks. Have leaves, snow, or ice cleared regularly. Use sand or salt on paths during winter. Clean up any spills in your garage right away. This includes grease or oil spills. What other actions can I take? Wear shoes that: Have a low heel. Do not wear high heels. Have rubber bottoms. Feel good on your feet and fit well. Are closed at the toe. Do not wear open-toe sandals. Use tools that help you move around if needed. These include: Canes. Walkers. Scooters. Crutches. Review your medicines with your doctor. Some medicines can make you feel dizzy. This can increase your chance of falling. Ask your doctor what else you can do to help prevent falls. Where to find more information Centers for Disease Control and Prevention, [employer]: [employer]Exhibition[employer].br [employer] on Aging: https://walker.com/ Contact a doctor if: You are afraid of falling at home. You feel weak, drowsy, or dizzy at home. You fall at home. Summary There are many simple things that you can do to make your home safe and to help prevent falls. Ways to make your home safe include removing things that can make you trip and installing  grab bars in the bathroom. Ask for help when making these changes in your home. This information  is not intended to replace advice given to you by your health care provider. Make sure you discuss any questions you have with your health care provider. Document Revised: 06/02/2021 Document Reviewed: 04/03/2020 Elsevier Patient Education  2023 [employer]  Health Maintenance, Female Adopting a healthy lifestyle and getting preventive care are important in promoting health and wellness. Ask your health care provider about: The right schedule for you to have regular tests and exams. Things you can do on your own to prevent diseases and keep yourself healthy. What should I know about diet, weight, and exercise? Eat a healthy diet  Eat a diet that includes plenty of vegetables, fruits, low-fat dairy products, and lean protein. Do not eat a lot of foods that are high in solid fats, added sugars, or sodium. Maintain a healthy weight Body mass index (BMI) is used to identify weight problems. It estimates body fat based on height and weight. Your health care provider can help determine your BMI and help you achieve or maintain a healthy weight. Get regular exercise Get regular exercise. This is one of the most important things you can do for your health. Most adults should: Exercise for at least 150 minutes each week. The exercise should increase your heart rate and make you sweat (moderate-intensity exercise). Do strengthening exercises at least twice a week. This is in addition to the moderate-intensity exercise. Spend less time sitting. Even light physical activity can be beneficial. Watch cholesterol and blood lipids Have your blood tested for lipids and cholesterol at 68 years of age, then have this test every 5 years. Have your cholesterol levels checked more often if: Your lipid or cholesterol levels are high. You are older than 68 years of age. You are at high risk for heart disease. What  should I know about cancer screening? Depending on your health history and family history, you may need to have cancer screening at various ages. This may include screening for: Breast cancer. Cervical cancer. Colorectal cancer. Skin cancer. Lung cancer. What should I know about heart disease, diabetes, and high blood pressure? Blood pressure and heart disease High blood pressure causes heart disease and increases the risk of stroke. This is more likely to develop in people who have high blood pressure readings or are overweight. Have your blood pressure checked: Every 3-5 years if you are 16-51 years of age. Every year if you are 73 years old or older. Diabetes Have regular diabetes screenings. This checks your fasting blood sugar level. Have the screening done: Once every three years after age 20 if you are at a normal weight and have a low risk for diabetes. More often and at a younger age if you are overweight or have a high risk for diabetes. What should I know about preventing infection? Hepatitis B If you have a higher risk for hepatitis B, you should be screened for this virus. Talk with your health care provider to find out if you are at risk for hepatitis B infection. Hepatitis C Testing is recommended for: Everyone born from [birth year] through 1967. Anyone with known risk factors for hepatitis C. Sexually transmitted infections (STIs) Get screened for STIs, including gonorrhea and chlamydia, if: You are sexually active and are younger than 68 years of age. You are older than 68 years of age and your health care provider tells you that you are at risk for this type of infection. Your sexual activity has changed since you were last screened, and you are at increased risk  for chlamydia or gonorrhea. Ask your health care provider if you are at risk. Ask your health care provider about whether you are at high risk for HIV. Your health care provider may recommend a prescription medicine  to help prevent HIV infection. If you choose to take medicine to prevent HIV, you should first get tested for HIV. You should then be tested every 3 months for as long as you are taking the medicine. Pregnancy If you are about to stop having your period (premenopausal) and you may become pregnant, seek counseling before you get pregnant. Take 400 to 800 micrograms (mcg) of folic acid every day if you become pregnant. Ask for birth control (contraception) if you want to prevent pregnancy. Osteoporosis and menopause Osteoporosis is a disease in which the bones lose minerals and strength with aging. This can result in bone fractures. If you are 65 years old or older, or if you are at risk for osteoporosis and fractures, ask your health care provider if you should: Be screened for bone loss. Take a calcium or vitamin D supplement to lower your risk of fractures. Be given hormone replacement therapy (HRT) to treat symptoms of menopause. Follow these instructions at home: Alcohol use Do not drink alcohol if: Your health care provider tells you not to drink. You are pregnant, may be pregnant, or are planning to become pregnant. If you drink alcohol: Limit how much you have to: 0-1 drink a day. Know how much alcohol is in your drink. In the [place] one drink equals one 12 oz bottle of beer (355 mL), one 5 oz glass of wine (148 mL), or one 1 oz glass of hard liquor (44 mL). Lifestyle Do not use any products that contain nicotine or tobacco. These products include cigarettes, chewing tobacco, and vaping devices, such as e-cigarettes. If you need help quitting, ask your health care provider. Do not use street drugs. Do not share needles. Ask your health care provider for help if you need support or information about quitting drugs. General instructions Schedule regular health, dental, and eye exams. Stay current with your vaccines. Tell your health care provider if: You often feel depressed. You  have ever been abused or do not feel safe at home. Summary Adopting a healthy lifestyle and getting preventive care are important in promoting health and wellness. Follow your health care provider's instructions about healthy diet, exercising, and getting tested or screened for diseases. Follow your health care provider's instructions on monitoring your cholesterol and blood pressure. This information is not intended to replace advice given to you by your health care provider. Make sure you discuss any questions you have with your health care provider. Document Revised: 01/20/2021 Document Reviewed: 01/20/2021 Elsevier Patient Education  2023 [employer]  Mammogram A mammogram is an X-ray of the breasts. This procedure can screen for and detect any changes that may indicate breast cancer. Mammograms are regularly done beginning at age 40 for women with average risk. A man may have a mammogram if he has a lump or swelling in his breast tissue. A mammogram can also identify other changes and variations in the breast, such as: Inflammation of the breast tissue (mastitis). An infected area that contains a collection of pus (abscess). A fluid-filled sac (cyst). Tumors that are not cancerous (benign). Fibrocystic changes. This is when breast tissue becomes denser and can make the tissue feel rope-like or uneven under the skin. Women at higher risk for breast cancer need earlier and more comprehensive screening   for abnormal changes. Breast tomosynthesis, or three-dimensional (3D) mammography, and digital breast tomosynthesis are advanced forms of imaging that create 3D pictures of the breasts. Tell a health care provider: About any allergies you have. If you have breast implants. If you have had previous breast disease, biopsy, or surgery. If you have a family history of breast cancer. If you are breastfeeding. Whether you are pregnant or may be pregnant. What are the risks? Generally, this is  a safe procedure. However, problems may occur, including: Exposure to radiation. Radiation levels are very low with this test. The need for more tests. The mammogram fails to detect certain cancers or the results are misinterpreted. Difficulty with detecting breast cancer in women with dense breasts. What happens before the procedure? Schedule your test about 1-2 weeks after your menstrual period if you are menstruating. This is usually when your breasts are the least tender. If you have had a mammogram done at a different facility in the past, get the mammogram X-rays or have them sent to your current exam facility. The new and old images will be compared. Wash your breasts and underarms on the day of the test. Do not wear deodorants, perfumes, lotions, or powders anywhere on your body on the day of the test. Remove any jewelry from your neck. Wear clothes that you can change into and out of easily. What happens during the procedure?  You will undress from the waist up and put on a gown that opens in the front. You will stand in front of the X-ray machine. Each breast will be placed between two plastic or glass plates. The plates will compress your breast for a few seconds. Try to stay as relaxed as possible. This procedure does not cause any harm to your breasts. Any discomfort you feel will be very brief. X-rays will be taken from different angles of each breast. The procedure may vary among health care providers and hospitals. What can I expect after the procedure? The mammogram will be examined by a specialist (radiologist). You may need to repeat certain parts of the test, depending on the quality of the images. This is done if the radiologist needs a better view of the breast tissue. You may resume your normal activities. It is up to you to get the results of your procedure. Ask your health care provider, or the department that is doing the procedure, when your results will be  ready. Summary A mammogram is an X-ray of the breasts. This procedure can screen for and detect any changes that may indicate breast cancer. A man may have a mammogram if he has a lump or swelling in his breast tissue. If you have had a mammogram done at a different facility in the past, get the mammogram X-rays or have them sent to your current exam facility in order to compare them. Schedule your test about 1-2 weeks after your menstrual period if you are menstruating. Ask when your test results will be ready. Make sure you get your test results. This information is not intended to replace advice given to you by your health care provider. Make sure you discuss any questions you have with your health care provider. Document Revised: 05/14/2021 Document Reviewed: 07/01/2020 Elsevier Patient Education  2023 [employer].

## 2022-07-03 ENCOUNTER — Other Ambulatory Visit: Payer: Self-pay | Admitting: Internal Medicine

## 2022-07-03 DIAGNOSIS — I10 Essential (primary) hypertension: Secondary | ICD-10-CM

## 2022-07-03 NOTE — Telephone Encounter (Signed)
Requested Prescriptions  Pending Prescriptions Disp Refills  . hydrochlorothiazide (HYDRODIURIL) 50 MG tablet [Pharmacy Med Name: HYDROCHLOROTHIAZIDE 50MG  TABLETS] 90 tablet 1    Sig: TAKE 1 TABLET(50 MG) BY MOUTH DAILY     Cardiovascular: Diuretics - Thiazide Failed - 07/03/2022  3:18 AM      Failed - K in normal range and within 180 days    Potassium  Date Value Ref Range Status  02/10/2022 2.8 (L) 3.5 - 5.2 mmol/L Final         Passed - Cr in normal range and within 180 days    Creatinine, Ser  Date Value Ref Range Status  02/10/2022 0.96 0.57 - 1.00 mg/dL Final         Passed - Na in normal range and within 180 days    Sodium  Date Value Ref Range Status  02/10/2022 140 134 - 144 mmol/L Final         Passed - Last BP in normal range    BP Readings from Last 1 Encounters:  02/10/22 132/80         Passed - Valid encounter within last 6 months    Recent Outpatient Visits          4 months ago Essential hypertension   [place] [facility] at [facility], Jesse Sans, MD   5 months ago Essential hypertension   [place] [facility] at [facility], Jesse Sans, MD      Future Appointments            In 1 month Army Melia, Jesse Sans, MD [place] and [facility] Medicine at [facility], [facility]

## 2022-08-14 ENCOUNTER — Encounter: Payer: Self-pay | Admitting: Internal Medicine

## 2022-08-14 ENCOUNTER — Ambulatory Visit (INDEPENDENT_AMBULATORY_CARE_PROVIDER_SITE_OTHER): Payer: Medicare HMO | Admitting: Internal Medicine

## 2022-08-14 VITALS — BP 118/80 | HR 69 | Ht 65.0 in | Wt 154.0 lb

## 2022-08-14 DIAGNOSIS — I10 Essential (primary) hypertension: Secondary | ICD-10-CM

## 2022-08-14 DIAGNOSIS — N3281 Overactive bladder: Secondary | ICD-10-CM

## 2022-08-14 DIAGNOSIS — R0781 Pleurodynia: Secondary | ICD-10-CM | POA: Diagnosis not present

## 2022-08-14 MED ORDER — OXYBUTYNIN CHLORIDE ER 5 MG PO TB24: 5.0000 mg | ORAL_TABLET | Freq: Every day | ORAL | 3 refills | Status: DC

## 2022-08-14 NOTE — Progress Notes (Signed)
Date:  08/14/2022   Name:  Taylor Khan   DOB:  Sep 12, 1956   MRN:  [id number]   Chief Complaint: Hypertension  Hypertension This is a chronic problem. The problem is controlled. Associated symptoms include chest pain. Pertinent negatives include no headaches, palpitations or shortness of breath. Past treatments include diuretics. The current treatment provides significant improvement.  Chest Pain  This is a new problem. Episode onset: fell against a table and hit her right lateral ribs on Thanksgiving. The onset quality is sudden. The problem has been waxing and waning. The pain is present in the lateral region. The pain is mild. The pain does not radiate. Pertinent negatives include no abdominal pain, cough, dizziness, headaches, palpitations, shortness of breath or weakness.  Her past medical history is significant for hypertension.   OAB - much benefit from ditropan.  Needs a refill.   Lab Results  Component Value Date   NA 140 02/10/2022   K 2.8 (L) 02/10/2022   CO2 29 02/10/2022   GLUCOSE 92 02/10/2022   BUN 24 02/10/2022   CREATININE 0.96 02/10/2022   CALCIUM 9.9 02/10/2022   EGFR 65 02/10/2022   Lab Results  Component Value Date   CHOL 208 (H) 02/10/2022   HDL 62 02/10/2022   LDLCALC 100 (H) 02/10/2022   TRIG 276 (H) 02/10/2022   CHOLHDL 3.4 02/10/2022   Lab Results  Component Value Date   TSH 2.750 02/10/2022   Lab Results  Component Value Date   HGBA1C 5.7 12/26/2019   Lab Results  Component Value Date   WBC 7.9 02/10/2022   HGB 15.8 02/10/2022   HCT 46.2 02/10/2022   MCV 84 02/10/2022   PLT 360 02/10/2022   Lab Results  Component Value Date   ALT 17 02/10/2022   AST 23 02/10/2022   ALKPHOS 136 (H) 02/10/2022   BILITOT 0.3 02/10/2022   No results found for: "25OHVITD2", "25OHVITD3", "VD25OH"   Review of Systems  Constitutional:  Negative for fatigue and unexpected weight change.  HENT:  Negative for nosebleeds.   Eyes:  Negative for visual  disturbance.  Respiratory:  Negative for cough, chest tightness, shortness of breath and wheezing.   Cardiovascular:  Positive for chest pain. Negative for palpitations and leg swelling.  Gastrointestinal:  Negative for abdominal pain, constipation and diarrhea.  Genitourinary:  Positive for frequency and urgency. Negative for dysuria.  Neurological:  Negative for dizziness, weakness, light-headedness and headaches.    Patient Active Problem List   Diagnosis Date Noted   Essential hypertension 01/09/2022   Centrilobular emphysema ([place]) 01/09/2022   Adrenal nodule ([place]) 01/09/2022   Environmental and seasonal allergies 01/09/2022   OAB (overactive bladder) 01/09/2022   Tobacco use disorder, severe, in early remission 01/09/2022    No Known Allergies  Past Surgical History:  Procedure Laterality Date   TONSILLECTOMY      Social History   Tobacco Use   Smoking status: Former    Packs/day: 1.00    Years: 35.00    Total pack years: 35.00    Types: Cigarettes    Quit date: 11/08/2019    Years since quitting: 2.7  Vaping Use   Vaping Use: Never used  Substance Use Topics   Alcohol use: Never   Drug use: Never     Medication list has been reviewed and updated.  Current Meds  Medication Sig   Ascorbic Acid (VITAMIN C PO) Take by mouth.   hydrochlorothiazide (HYDRODIURIL) 50 MG tablet TAKE 1  TABLET(50 MG) BY MOUTH DAILY   MAGNESIUM PO Take by mouth.   potassium chloride SA (KLOR-CON M) 20 MEQ tablet Take 1 tablet (20 mEq total) by mouth daily.   triamcinolone (NASACORT ALLERGY 24HR) 55 MCG/ACT AERO nasal inhaler Place 2 sprays into the nose daily.   VITAMIN D PO Take by mouth.   [DISCONTINUED] oxybutynin (DITROPAN XL) 5 MG 24 hr tablet Take 1 tablet (5 mg total) by mouth at bedtime.       08/14/2022    2:13 PM 02/10/2022    2:46 PM 01/09/2022    2:58 PM  GAD 7 : Generalized Anxiety Score  Nervous, Anxious, on Edge 0 0 0  Control/stop worrying 0 0 0  Worry too much -  different things 1 0 0  Trouble relaxing 0 0 0  Restless 0 0 0  Easily annoyed or irritable 0 0 0  Afraid - awful might happen 0 0 0  Total GAD 7 Score 1 0 0  Anxiety Difficulty Not difficult at all Not difficult at all        08/14/2022    2:12 PM 04/08/2022    1:52 PM 02/10/2022    2:46 PM  Depression screen PHQ 2/9  Decreased Interest _0 Down, Depressed, Hopeless 2 0 0  PHQ - 2 Score _1 Altered sleeping _2 Tired, decreased energy _3 Change in appetite 0 2 1  Feeling bad or failure about yourself  0 0 1  Trouble concentrating 1 0 2  Moving slowly or fidgety/restless 0 1 0  Suicidal thoughts 0 0 0  PHQ-9 Score _4 Difficult doing work/chores Somewhat difficult Somewhat difficult Not difficult at all    BP Readings from Last 3 Encounters:  08/14/22 118/80  02/10/22 132/80  01/09/22 (!) 155/96    Physical Exam Vitals and nursing note reviewed.  Constitutional:      General: She is not in acute distress.    Appearance: Normal appearance. She is well-developed.  HENT:     Head: Normocephalic and atraumatic.  Cardiovascular:     Rate and Rhythm: Normal rate and regular rhythm.  Pulmonary:     Effort: Pulmonary effort is normal. No respiratory distress.     Breath sounds: No wheezing or rhonchi.  Chest:       Comments: Tenderness with no bruising or deformity  Musculoskeletal:     Cervical back: Normal range of motion.  Skin:    General: Skin is warm and dry.     Findings: No rash.  Neurological:     Mental Status: She is alert and oriented to person, place, and time.  Psychiatric:        Mood and Affect: Mood normal.        Behavior: Behavior normal.     Wt Readings from Last 3 Encounters:  08/14/22 154 lb (69.9 kg)  02/10/22 153 lb (69.4 kg)  01/09/22 159 lb (72.1 kg)    BP 118/80 (BP Location: Right Arm, Cuff Size: Large)   Pulse 69   Ht _5  (1.651 m)   Wt 154 lb (69.9 kg)   SpO2 98%   BMI 25.63 kg/m   Assessment and  Plan: 1. Essential hypertension Clinically stable exam with well controlled BP. Tolerating medications without side effects at this time. Pt to continue current regimen and low sodium diet; benefits of regular exercise as able discussed.  2. OAB (overactive bladder)  Continue Ditropan daily - oxybutynin (DITROPAN XL) 5 MG 24 hr tablet; Take 1 tablet (5 mg total) by mouth at bedtime.  Dispense: 90 tablet; Refill: 3  3. Rib pain on right side No obvious deformity or bruising Pt is not in severe pain and would prefer watchful waiting Imaging of the ribs is therefore deferred - she can return at any time if symptoms worsen   Partially dictated using Dragon software. Any errors are unintentional.  Halina Maidens, MD [facility]  08/14/2022

## 2022-09-12 DIAGNOSIS — H524 Presbyopia: Secondary | ICD-10-CM | POA: Diagnosis not present

## 2022-09-24 DIAGNOSIS — H524 Presbyopia: Secondary | ICD-10-CM | POA: Diagnosis not present

## 2022-09-24 DIAGNOSIS — H52223 Regular astigmatism, bilateral: Secondary | ICD-10-CM | POA: Diagnosis not present

## 2022-09-29 ENCOUNTER — Other Ambulatory Visit: Payer: Self-pay | Admitting: Internal Medicine

## 2022-09-29 DIAGNOSIS — I10 Essential (primary) hypertension: Secondary | ICD-10-CM

## 2022-09-29 NOTE — Telephone Encounter (Signed)
Requested Prescriptions  Pending Prescriptions Disp Refills   hydrochlorothiazide (HYDRODIURIL) 50 MG tablet [Pharmacy Med Name: HYDROCHLOROTHIAZIDE 50MG  TABLETS] 90 tablet 0    Sig: TAKE 1 TABLET(50 MG) BY MOUTH DAILY     Cardiovascular: Diuretics - Thiazide Failed - 09/29/2022  3:19 AM      Failed - Cr in normal range and within 180 days    Creatinine, Ser  Date Value Ref Range Status  02/10/2022 0.96 0.57 - 1.00 mg/dL Final         Failed - K in normal range and within 180 days    Potassium  Date Value Ref Range Status  02/10/2022 2.8 (L) 3.5 - 5.2 mmol/L Final         Failed - Na in normal range and within 180 days    Sodium  Date Value Ref Range Status  02/10/2022 140 134 - 144 mmol/L Final         Passed - Last BP in normal range    BP Readings from Last 1 Encounters:  08/14/22 118/80         Passed - Valid encounter within last 6 months    Recent Outpatient Visits           1 month ago Rib pain on right side   [place] [facility] at [facility], Jesse Sans, MD   7 months ago Essential hypertension   [place] [facility] at [facility], Jesse Sans, MD   8 months ago Essential hypertension   [place] [facility] at [facility], Jesse Sans, MD       Future Appointments             In 4 months Army Melia, Jesse Sans, MD [facility] Medicine at [facility], [facility]

## 2022-12-28 IMAGING — MR MR ABDOMEN WO/W CM
18 series · 48 of 48 positions shown · IV contrast (7 ml Gadavist)
Comparison: CT chest 01/02/2020

CLINICAL DATA: Adrenal mass evaluation

EXAM:
MRI ABDOMEN WITHOUT AND WITH CONTRAST
TECHNIQUE: Multiplanar multisequence MR imaging of the abdomen was performed
both before and after the administration of intravenous contrast.
CONTRAST:  7mL GADAVIST GADOBUTROL 1 MMOL/ML IV SOLN

[Series 2: cor haste · coronal · 6.0mm · 1.19mm/px · 1 of 30 slices shown]
[im 1/30]
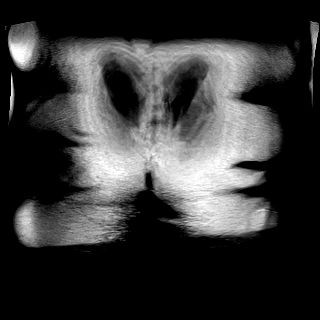

[Series 3: ax haste · axial · 6.0mm · 1.19mm/px · 1 of 30 slices shown]
[im 1/30]
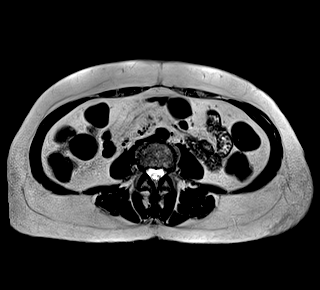

[Series 5: T2 fat-sat · axial · 6.0mm · 1.19mm/px · 1 of 37 slices shown]
[im 1/37]
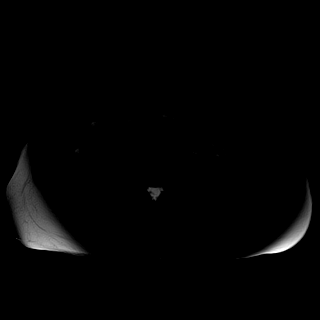

[Series 7: DWI · axial · 6.0mm · 1.42mm/px · z∈[-139,+120]mm · 4 of 111 slices shown (1 of 2)]
[im 1/111]
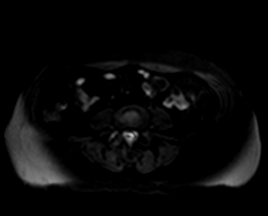
[im 37/111]
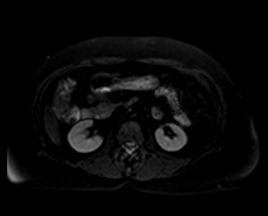
[im 74/111]
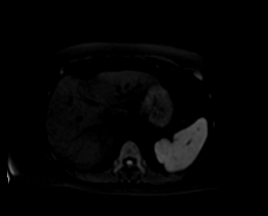
[im 111/111]
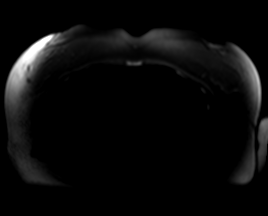

[Series 8: DWI · axial · 6.0mm · 1.42mm/px · 1 of 37 slices shown (2 of 2)]
[im 1/37]
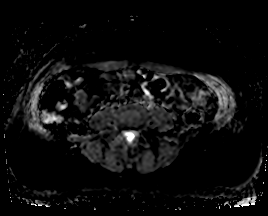

[Series 9: in & out · axial · 3.0mm · 1.19mm/px · z∈[-140,+121]mm · 3 of 88 slices shown (1 of 2)]
[im 1/88]
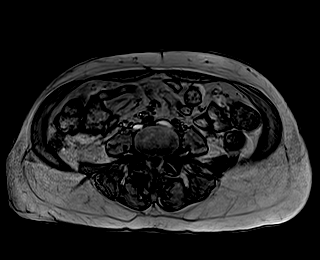
[im 44/88]
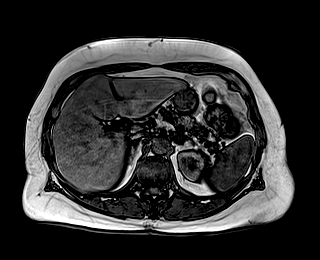
[im 88/88]
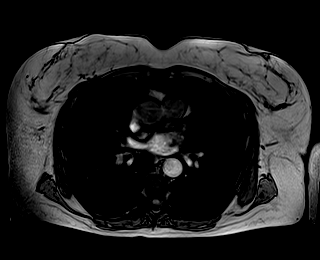

[Series 10: in & out · axial · 3.0mm · 1.19mm/px · z∈[-140,+121]mm · 3 of 88 slices shown (2 of 2)]
[im 1/88]
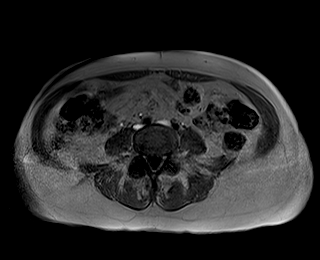
[im 44/88]
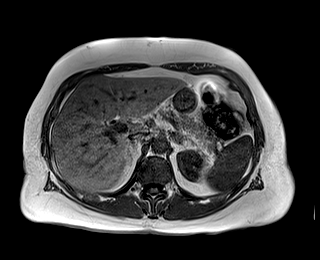
[im 88/88]
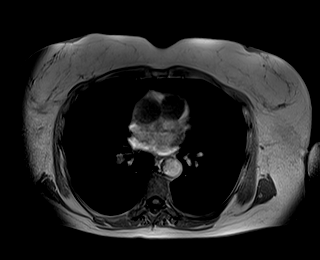

[Series 11: cor in-opp · coronal · 3.0mm · 1.19mm/px · 5 of 144 slices shown]
[im 1/144]
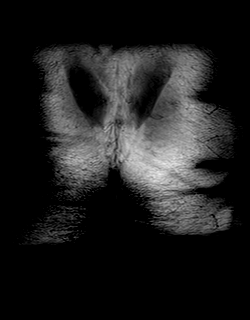
[im 36/144]
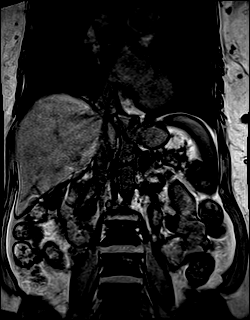
[im 72/144]
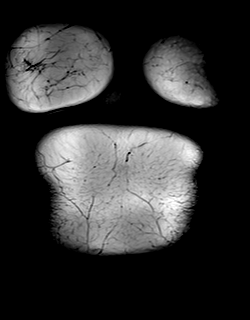
[im 108/144]
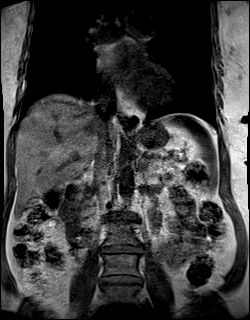
[im 144/144]
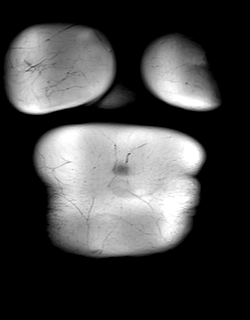

[Series 12: T1 dynamic fat-sat · axial · non-contrast · 3.0mm · 1.19mm/px · z∈[-140,+121]mm · 3 of 88 slices shown (1 of 5)]
[im 1/88]
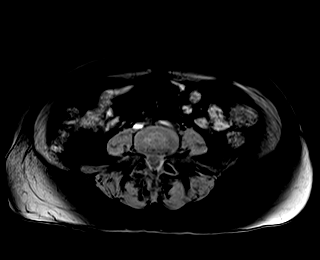
[im 44/88]
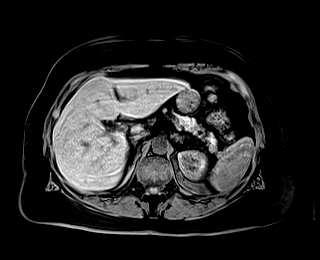
[im 88/88]
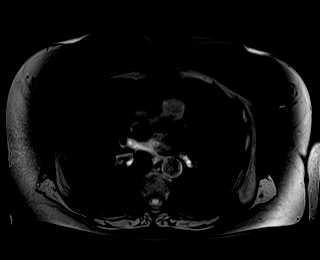

[Series 13: T1 dynamic fat-sat post-contrast · axial · 3.0mm · 1.19mm/px · z∈[-140,+121]mm · 3 of 88 slices shown (1 of 4)]
[im 1/88]
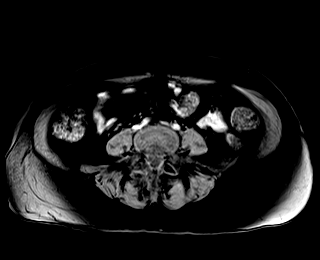
[im 44/88]
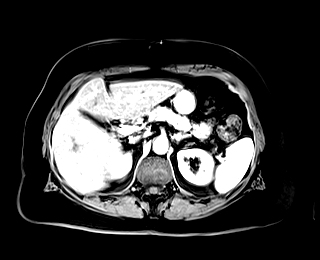
[im 88/88]
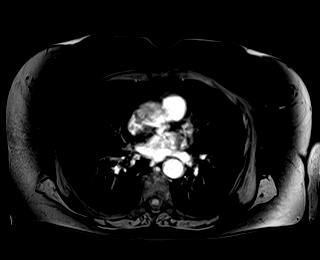

[Series 14: T1 dynamic fat-sat · axial · 3.0mm · 1.19mm/px · z∈[-140,+121]mm · 3 of 88 slices shown (2 of 5)]
[im 1/88]
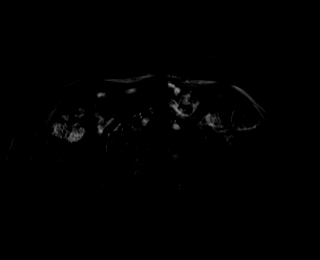
[im 44/88]
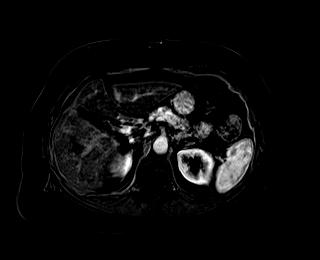
[im 88/88]
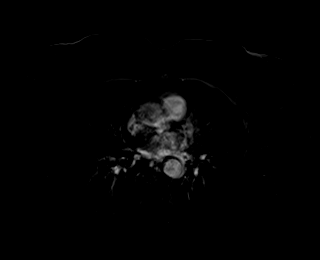

[Series 15: T1 dynamic fat-sat post-contrast · axial · 3.0mm · 1.19mm/px · z∈[-140,+121]mm · 3 of 88 slices shown (2 of 4)]
[im 1/88]
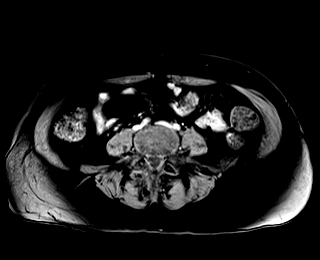
[im 44/88]
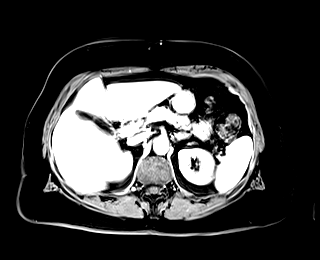
[im 88/88]
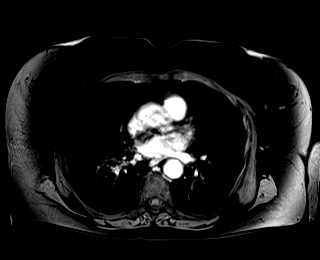

[Series 16: T1 dynamic fat-sat · axial · 3.0mm · 1.19mm/px · z∈[-140,+121]mm · 3 of 88 slices shown (3 of 5)]
[im 1/88]
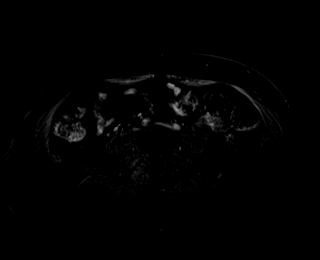
[im 44/88]
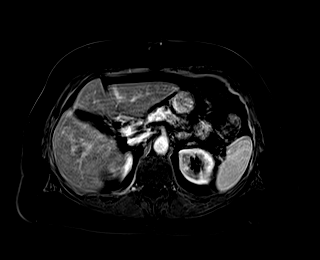
[im 88/88]
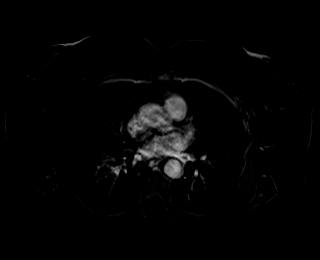

[Series 17: T1 dynamic fat-sat post-contrast · axial · 3.0mm · 1.19mm/px · z∈[-140,+121]mm · 3 of 88 slices shown (3 of 4)]
[im 1/88]
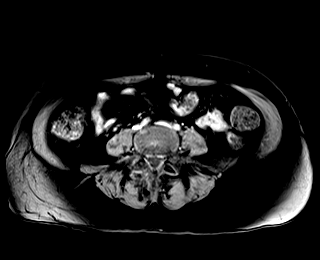
[im 44/88]
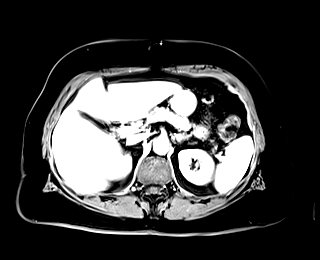
[im 88/88]
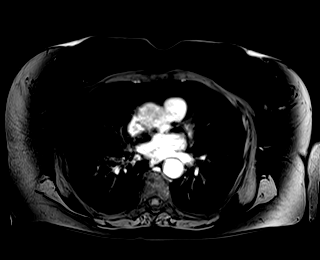

[Series 18: T1 dynamic fat-sat · axial · 3.0mm · 1.19mm/px · z∈[-140,+121]mm · 3 of 88 slices shown (4 of 5)]
[im 1/88]
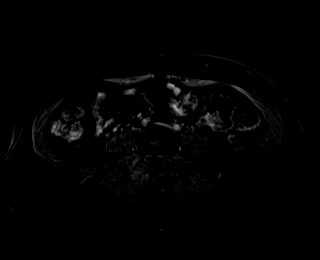
[im 44/88]
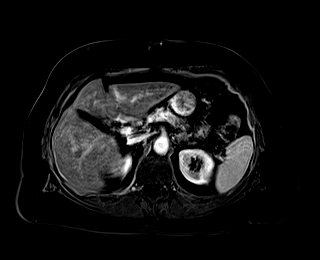
[im 88/88]
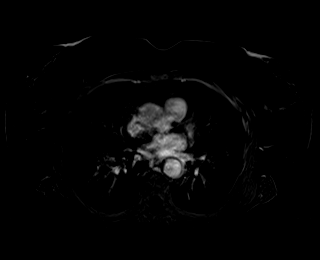

[Series 19: T1 dynamic post-contrast · coronal · 3.0mm · 1.31mm/px · 2 of 72 slices shown]
[im 1/72]
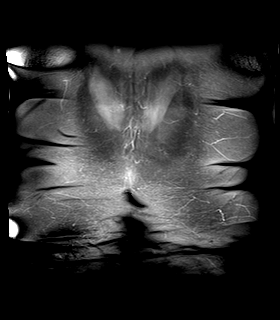
[im 72/72]
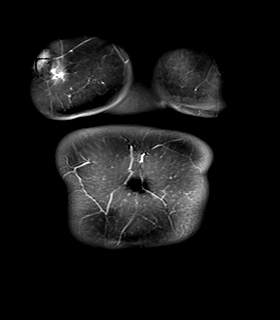

[Series 20: T1 dynamic fat-sat post-contrast · axial · 3.0mm · 1.19mm/px · z∈[-140,+121]mm · 3 of 88 slices shown (4 of 4)]
[im 1/88]
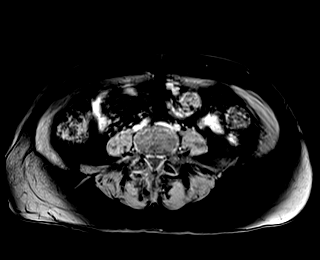
[im 44/88]
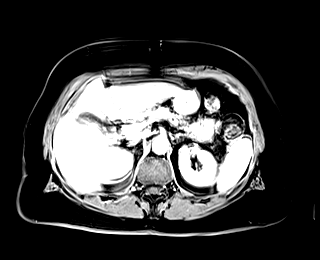
[im 88/88]
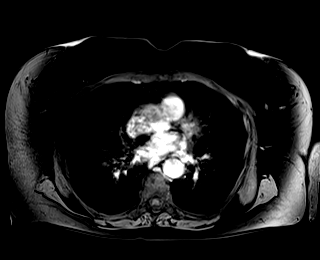

[Series 21: T1 dynamic fat-sat · axial · 3.0mm · 1.19mm/px · z∈[-140,+121]mm · 3 of 88 slices shown (5 of 5)]
[im 1/88]
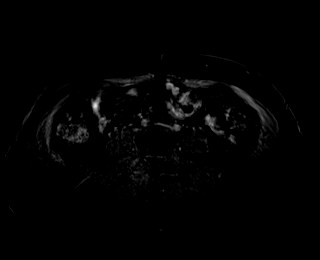
[im 44/88]
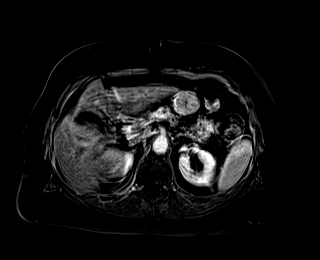
[im 88/88]
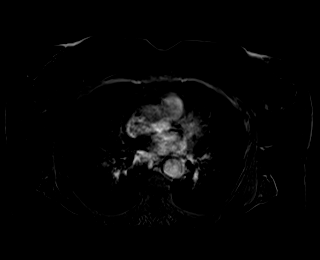

[48 of 48 positions shown; findings below may reference images not displayed]

FINDINGS: Lower chest: No acute findings.

Hepatobiliary: Liver is normal in size and contour, with no
suspicious mass identified. 1.3 cm gallstone in the fundus of the
gallbladder. No biliary ductal dilatation or inflammatory changes
around the gallbladder.

Pancreas: No mass, inflammatory changes, or other parenchymal
abnormality identified.

Spleen:  Within normal limits in size and appearance.

Adrenals/Urinary Tract: 1.3 cm left adrenal gland nodule which
demonstrates out of phase T1 signal dropout, and is consistent with
an adenoma. Right adrenal gland appears normal. Bilateral kidneys
are within normal limits.

Stomach/Bowel: Small hiatal hernia. No evidence of bowel
obstruction.

Vascular/Lymphatic: No pathologically enlarged lymph nodes
identified. No abdominal aortic aneurysm demonstrated.

Other:  No ascites.

Musculoskeletal: No suspicious bone lesions identified.
IMPRESSION: 1. Left adrenal nodule is consistent with adenoma.
2. Small hiatal hernia.
3. Cholelithiasis.

## 2023-01-01 ENCOUNTER — Other Ambulatory Visit: Payer: Self-pay | Admitting: Internal Medicine

## 2023-01-01 DIAGNOSIS — I10 Essential (primary) hypertension: Secondary | ICD-10-CM

## 2023-01-01 NOTE — Telephone Encounter (Signed)
Requested Prescriptions  Pending Prescriptions Disp Refills   hydrochlorothiazide (HYDRODIURIL) 50 MG tablet [Pharmacy Med Name: HYDROCHLOROTHIAZIDE  TABLETS] 90 tablet 0    Sig: TAKE 1 TABLET(50 MG) BY MOUTH DAILY     Cardiovascular: Diuretics - Thiazide Failed - 01/01/2023  3:16 AM      Failed - Cr in normal range and within 180 days    Creatinine, Ser  Date Value Ref Range Status  02/10/2022 0.96 0.57 - 1.00 mg/dL Final         Failed - K in normal range and within 180 days    Potassium  Date Value Ref Range Status  02/10/2022 2.8 (L) 3.5 - 5.2 mmol/L Final         Failed - Na in normal range and within 180 days    Sodium  Date Value Ref Range Status  02/10/2022 140 134 - 144 mmol/L Final         Passed - Last BP in normal range    BP Readings from Last 1 Encounters:  08/14/22 118/80         Passed - Valid encounter within last 6 months    Recent Outpatient Visits           4 months ago Rib pain on right side   [facility] at [facility], Nyoka Cowden, MD   10 months ago Essential hypertension   [place] [facility] at [facility], Nyoka Cowden, MD   11 months ago Essential hypertension   [place] [facility] at [facility], Nyoka Cowden, MD       Future Appointments             In 1 month Judithann Graves, Nyoka Cowden, MD [facility] Primary [facility] & Sports Medicine at [facility], [facility]

## 2023-02-13 ENCOUNTER — Other Ambulatory Visit: Payer: Self-pay | Admitting: Internal Medicine

## 2023-02-13 DIAGNOSIS — E876 Hypokalemia: Secondary | ICD-10-CM

## 2023-02-15 NOTE — Telephone Encounter (Signed)
Requested Prescriptions  Pending Prescriptions Disp Refills   potassium chloride SA (KLOR-CON M) 20 MEQ tablet [Pharmacy Med Name: POTASSIUM CL ER TABLETS] 90 tablet 0    Sig: TAKE 1 TABLET(20 MEQ) BY MOUTH DAILY     Endocrinology:  Minerals - Potassium Supplementation Failed - 02/13/2023  3:18 AM      Failed - K in normal range and within 360 days    Potassium  Date Value Ref Range Status  02/10/2022 2.8 (L) 3.5 - 5.2 mmol/L Final         Failed - Cr in normal range and within 360 days    Creatinine, Ser  Date Value Ref Range Status  02/10/2022 0.96 0.57 - 1.00 mg/dL Final         Passed - Valid encounter within last 12 months    Recent Outpatient Visits           6 months ago Rib pain on right side   [place] [facility] at [facility], Nyoka Cowden, MD   1 year ago Essential hypertension   [place] [facility] at [facility], Nyoka Cowden, MD   1 year ago Essential hypertension   [place] [facility] at [facility], Nyoka Cowden, MD       Future Appointments             Tomorrow Judithann Graves, Nyoka Cowden, MD [facility] Primary Care & Sports Medicine at [facility], [facility]

## 2023-02-16 ENCOUNTER — Ambulatory Visit (INDEPENDENT_AMBULATORY_CARE_PROVIDER_SITE_OTHER): Payer: Medicare HMO | Admitting: Internal Medicine

## 2023-02-16 ENCOUNTER — Ambulatory Visit
Admission: RE | Admit: 2023-02-16 | Discharge: 2023-02-16 | Disposition: A | Discharge status: Home / Self Care | Payer: Medicare HMO | Source: Ambulatory Visit | Attending: Internal Medicine | Admitting: Internal Medicine

## 2023-02-16 ENCOUNTER — Encounter: Payer: Self-pay | Admitting: Internal Medicine

## 2023-02-16 ENCOUNTER — Ambulatory Visit
Admission: RE | Admit: 2023-02-16 | Discharge: 2023-02-16 | Disposition: A | Discharge status: Home / Self Care | Payer: Medicare HMO | Attending: Internal Medicine | Admitting: Internal Medicine

## 2023-02-16 VITALS — BP 128/72 | HR 63 | Ht 65.0 in | Wt 152.0 lb

## 2023-02-16 DIAGNOSIS — Z1231 Encounter for screening mammogram for malignant neoplasm of breast: Secondary | ICD-10-CM

## 2023-02-16 DIAGNOSIS — Z1159 Encounter for screening for other viral diseases: Secondary | ICD-10-CM

## 2023-02-16 DIAGNOSIS — H9193 Unspecified hearing loss, bilateral: Secondary | ICD-10-CM | POA: Diagnosis not present

## 2023-02-16 DIAGNOSIS — M545 Low back pain, unspecified: Secondary | ICD-10-CM | POA: Insufficient documentation

## 2023-02-16 DIAGNOSIS — E782 Mixed hyperlipidemia: Secondary | ICD-10-CM

## 2023-02-16 DIAGNOSIS — Z0000 Encounter for general adult medical examination without abnormal findings: Secondary | ICD-10-CM

## 2023-02-16 DIAGNOSIS — I10 Essential (primary) hypertension: Secondary | ICD-10-CM | POA: Diagnosis not present

## 2023-02-16 DIAGNOSIS — Z23 Encounter for immunization: Secondary | ICD-10-CM

## 2023-02-16 DIAGNOSIS — Z1211 Encounter for screening for malignant neoplasm of colon: Secondary | ICD-10-CM

## 2023-02-16 DIAGNOSIS — F17201 Nicotine dependence, unspecified, in remission: Secondary | ICD-10-CM

## 2023-02-16 DIAGNOSIS — R1310 Dysphagia, unspecified: Secondary | ICD-10-CM

## 2023-02-16 MED ORDER — HYDROCHLOROTHIAZIDE 50 MG PO TABS: ORAL_TABLET | ORAL | 1 refills | Status: DC

## 2023-02-16 MED ORDER — IBUPROFEN 600 MG PO TABS
600.0000 mg | ORAL_TABLET | Freq: Three times a day (TID) | ORAL | 0 refills | Status: DC | PRN
Start: 2023-02-16 — End: 2023-05-14

## 2023-02-16 NOTE — Assessment & Plan Note (Signed)
Fell 6 months ago which started the back pain It has not improved much - advil has been helpful Will get Lumbar films, will need DEXA if abnormal

## 2023-02-16 NOTE — Assessment & Plan Note (Signed)
Had LDCT screening in 2021 Recommend yearly

## 2023-02-16 NOTE — Assessment & Plan Note (Signed)
With tinnitus Appears mild at this time - ENT evaluation if desired

## 2023-02-16 NOTE — Assessment & Plan Note (Signed)
Continue small bites and eat slowly Pt denies reflux May need to see GI if symptoms progress

## 2023-02-16 NOTE — Assessment & Plan Note (Signed)
Cholesterol borderline elevated. Not on any statin medications. Will recheck and advise

## 2023-02-16 NOTE — Progress Notes (Signed)
Date:  02/16/2023   Name:  Taylor Khan   DOB:  12-18-1955   MRN:  [id number]   Chief Complaint: Annual Exam Taylor Khan is a 67 y.o. female who presents today for her Complete Annual Exam. She feels well. She reports exercising - none. She reports she is sleeping poorly. Breast complaints - none.  Mammogram: none DEXA: none Colonoscopy: none  Health Maintenance Due  Topic Date Due   COVID-19 Vaccine (1) Never done   Hepatitis C Screening  Never done   DTaP/Tdap/Td (1 - Tdap) Never done   Colonoscopy  Never done   MAMMOGRAM  Never done   Zoster Vaccines- Shingrix (1 of 2) Never done   DEXA SCAN  Never done   Lung Cancer Screening  01/01/2021   Medicare Annual Wellness (AWV)  04/09/2023    Immunization History  Administered Date(s) Administered   PNEUMOCOCCAL CONJUGATE-20 02/16/2023    Hypertension This is a chronic problem. The problem is controlled. Pertinent negatives include no chest pain, headaches, palpitations or shortness of breath. Past treatments include diuretics.  Back Pain This is a new problem. Episode onset: onset 6 months ago. The pain is present in the lumbar spine. The quality of the pain is described as aching. Pertinent negatives include no abdominal pain, chest pain, dysuria, fever or headaches.  Tobacco use - quit in 2021.  Had on CT screening but none since. Dysphagia - occurs intermittently, requires her to regurgitate food.  Has never been to ER with symptoms. No reflux symptoms.    Lab Results  Component Value Date   NA 140 02/10/2022   K 2.8 (L) 02/10/2022   CO2 29 02/10/2022   GLUCOSE 92 02/10/2022   BUN 24 02/10/2022   CREATININE 0.96 02/10/2022   CALCIUM 9.9 02/10/2022   EGFR 65 02/10/2022   Lab Results  Component Value Date   CHOL 208 (H) 02/10/2022   HDL 62 02/10/2022   LDLCALC 100 (H) 02/10/2022   TRIG 276 (H) 02/10/2022   CHOLHDL 3.4 02/10/2022   Lab Results  Component Value Date   TSH 2.750 02/10/2022   Lab Results   Component Value Date   HGBA1C 5.7 12/26/2019   Lab Results  Component Value Date   WBC 7.9 02/10/2022   HGB 15.8 02/10/2022   HCT 46.2 02/10/2022   MCV 84 02/10/2022   PLT 360 02/10/2022   Lab Results  Component Value Date   ALT 17 02/10/2022   AST 23 02/10/2022   ALKPHOS 136 (H) 02/10/2022   BILITOT 0.3 02/10/2022   No results found for: "25OHVITD2", "25OHVITD3", "VD25OH"   Review of Systems  Constitutional:  Negative for chills, fatigue and fever.  HENT:  Positive for hearing loss and tinnitus. Negative for congestion, facial swelling, trouble swallowing and voice change.   Eyes:  Negative for visual disturbance.  Respiratory:  Negative for cough, chest tightness, shortness of breath and wheezing.   Cardiovascular:  Negative for chest pain, palpitations and leg swelling.  Gastrointestinal:  Negative for abdominal pain, constipation, diarrhea and vomiting.       Intermittent dysphagia   Endocrine: Negative for polydipsia and polyuria.  Genitourinary:  Negative for dysuria, frequency, genital sores, vaginal bleeding and vaginal discharge.  Musculoskeletal:  Positive for back pain. Negative for arthralgias, gait problem, joint swelling and myalgias.  Skin:  Negative for color change and rash.  Neurological:  Negative for dizziness, tremors, light-headedness and headaches.  Hematological:  Negative for adenopathy. Does not bruise/bleed  easily.  Psychiatric/Behavioral:  Negative for dysphoric mood and sleep disturbance. The patient is not nervous/anxious.     Patient Active Problem List   Diagnosis Date Noted   Mixed hyperlipidemia 02/16/2023   Lumbar back pain 02/16/2023   Bilateral hearing loss 02/16/2023   Dysphagia 02/16/2023   Essential hypertension 01/09/2022   Centrilobular emphysema (HCC) 01/09/2022   Adrenal nodule (HCC) 01/09/2022   Environmental and seasonal allergies 01/09/2022   OAB (overactive bladder) 01/09/2022   Tobacco use disorder, severe, in early  remission 01/09/2022    No Known Allergies  Past Surgical History:  Procedure Laterality Date   TONSILLECTOMY      Social History   Tobacco Use   Smoking status: Former    Packs/day: 1.00    Years: 35.00    Additional pack years: 0.00    Total pack years: 35.00    Types: Cigarettes    Quit date: 11/08/2019    Years since quitting: 3.2  Vaping Use   Vaping Use: Never used  Substance Use Topics   Alcohol use: Never   Drug use: Never     Medication list has been reviewed and updated.  Current Meds  Medication Sig   Ascorbic Acid (VITAMIN C PO) Take by mouth.   ibuprofen (ADVIL) 600 MG tablet Take 1 tablet (600 mg total) by mouth every 8 (eight) hours as needed.   MAGNESIUM PO Take by mouth.   oxybutynin (DITROPAN XL) 5 MG 24 hr tablet Take 1 tablet (5 mg total) by mouth at bedtime.   potassium chloride SA (KLOR-CON M) 20 MEQ tablet TAKE 1 TABLET(20 MEQ) BY MOUTH DAILY   triamcinolone (NASACORT ALLERGY 24HR) 55 MCG/ACT AERO nasal inhaler Place 2 sprays into the nose daily.   VITAMIN D PO Take by mouth.   [DISCONTINUED] hydrochlorothiazide (HYDRODIURIL) 50 MG tablet TAKE 1 TABLET(50 MG) BY MOUTH DAILY   [DISCONTINUED] ibuprofen (ADVIL) 600 MG tablet Take 600 mg by mouth every 6 (six) hours as needed.       02/16/2023   10:46 AM 08/14/2022    2:13 PM 02/10/2022    2:46 PM 01/09/2022    2:58 PM  GAD 7 : Generalized Anxiety Score  Nervous, Anxious, on Edge 0 0 0 0  Control/stop worrying 0 0 0 0  Worry too much - different things 0 1 0 0  Trouble relaxing 0 0 0 0  Restless 0 0 0 0  Easily annoyed or irritable 0 0 0 0  Afraid - awful might happen 0 0 0 0  Total GAD 7 Score 0 1 0 0  Anxiety Difficulty Not difficult at all Not difficult at all Not difficult at all        02/16/2023   10:46 AM 08/14/2022    2:12 PM 04/08/2022    1:52 PM  Depression screen PHQ 2/9  Decreased Interest 2 1 2   Down, Depressed, Hopeless 0 2 0  PHQ - 2 Score 2 3 2   Altered sleeping 3 3 2    Tired, decreased energy 3 3 2   Change in appetite 1 0 2  Feeling bad or failure about yourself  3 0 0  Trouble concentrating 3 1 0  Moving slowly or fidgety/restless 2 0 1  Suicidal thoughts 0 0 0  PHQ-9 Score 17 10 9   Difficult doing work/chores Somewhat difficult Somewhat difficult Somewhat difficult    BP Readings from Last 3 Encounters:  02/16/23 128/72  08/14/22 118/80  02/10/22 132/80    Physical  Exam Vitals and nursing note reviewed.  Constitutional:      General: She is not in acute distress.    Appearance: Normal appearance. She is well-developed.  HENT:     Head: Normocephalic and atraumatic.     Right Ear: Tympanic membrane and ear canal normal.     Left Ear: Tympanic membrane and ear canal normal.     Nose:     Right Sinus: No maxillary sinus tenderness.     Left Sinus: No maxillary sinus tenderness.  Eyes:     General: No scleral icterus.       Right eye: No discharge.        Left eye: No discharge.     Conjunctiva/sclera: Conjunctivae normal.  Neck:     Thyroid: No thyromegaly.     Vascular: No carotid bruit.  Cardiovascular:     Rate and Rhythm: Normal rate and regular rhythm.     Pulses: Normal pulses.     Heart sounds: Normal heart sounds.  Pulmonary:     Effort: Pulmonary effort is normal. No respiratory distress.     Breath sounds: No wheezing.  Chest:  Breasts:    Right: No mass, nipple discharge, skin change or tenderness.     Left: No mass, nipple discharge, skin change or tenderness.  Abdominal:     General: Bowel sounds are normal.     Palpations: Abdomen is soft.     Tenderness: There is no abdominal tenderness.  Musculoskeletal:     Cervical back: Normal range of motion. No erythema.     Lumbar back: No tenderness or bony tenderness. Positive right straight leg raise test and positive left straight leg raise test.     Right lower leg: Normal. No edema.     Left lower leg: Normal. No edema.  Lymphadenopathy:     Cervical: No cervical  adenopathy.  Skin:    General: Skin is warm and dry.     Findings: No rash.  Neurological:     Mental Status: She is alert and oriented to person, place, and time.     Cranial Nerves: No cranial nerve deficit.     Sensory: No sensory deficit.     Deep Tendon Reflexes: Reflexes are normal and symmetric.  Psychiatric:        Attention and Perception: Attention and perception normal.        Mood and Affect: Mood normal.        Speech: Speech normal.     Wt Readings from Last 3 Encounters:  02/16/23 152 lb (68.9 kg)  08/14/22 154 lb (69.9 kg)  02/10/22 153 lb (69.4 kg)    BP 128/72   Pulse 63   Ht 5\' 5"  (1.651 m)   Wt 152 lb (68.9 kg)   SpO2 94%   BMI 25.29 kg/m   Assessment and Plan:  Problem List Items Addressed This Visit     Tobacco use disorder, severe, in early remission    Had LDCT screening in 2021 Recommend yearly      Relevant Orders   Ambulatory Referral Lung Cancer Screening [facility]   Mixed hyperlipidemia    Cholesterol borderline elevated. Not on any statin medications. Will recheck and advise      Relevant Medications   hydrochlorothiazide (HYDRODIURIL) 50 MG tablet   Other Relevant Orders   Lipid panel   Lumbar back pain    Fell 6 months ago which started the back pain It has not improved much -  advil has been helpful Will get Lumbar films, will need DEXA if abnormal      Relevant Medications   ibuprofen (ADVIL) 600 MG tablet   Other Relevant Orders   DG Lumbar Spine Complete   Essential hypertension (Chronic)    Stable exam with well controlled BP.  Currently taking hctz. Tolerating medications without concerns or side effects. Will continue to recommend low sodium diet and current regimen.       Relevant Medications   hydrochlorothiazide (HYDRODIURIL) 50 MG tablet   Other Relevant Orders   CBC with Differential/Platelet   Comprehensive metabolic panel   TSH   Dysphagia    Continue small bites and eat slowly Pt denies  reflux May need to see GI if symptoms progress      Bilateral hearing loss    With tinnitus Appears mild at this time - ENT evaluation if desired      Other Visit Diagnoses     Annual physical exam    -  Primary   Encounter for screening mammogram for breast cancer       she declines mammogram   Colon cancer screening       Relevant Orders   Fecal occult blood, imunochemical   Need for hepatitis C screening test       Relevant Orders   Hepatitis C antibody   Need for vaccination for pneumococcus       Relevant Orders   Pneumococcal conjugate vaccine 20-valent (Completed)       Return in about 4 months (around 06/18/2023) for HTN, dysphagia.   Partially dictated using Dragon software, any errors are not intentional.  Reubin Milan, MD [facility] [place], [place]

## 2023-02-16 NOTE — Assessment & Plan Note (Signed)
Stable exam with well controlled BP.  Currently taking hctz. Tolerating medications without concerns or side effects. Will continue to recommend low sodium diet and current regimen.  

## 2023-02-17 LAB — COMPREHENSIVE METABOLIC PANEL
ALT: 13 IU/L (ref 0–32)
AST: 25 IU/L (ref 0–40)
Albumin/Globulin Ratio: 1.8 (ref 1.2–2.2)
Albumin: 4.4 g/dL (ref 3.9–4.9)
Alkaline Phosphatase: 116 IU/L (ref 44–121)
BUN/Creatinine Ratio: 16 (ref 12–28)
BUN: 16 mg/dL (ref 8–27)
Bilirubin Total: 0.4 mg/dL (ref 0.0–1.2)
CO2: 28 mmol/L (ref 20–29)
Calcium: 10.1 mg/dL (ref 8.7–10.3)
Chloride: 98 mmol/L (ref 96–106)
Creatinine, Ser: 0.97 mg/dL (ref 0.57–1.00)
Globulin, Total: 2.5 g/dL (ref 1.5–4.5)
Glucose: 92 mg/dL (ref 70–99)
Potassium: 3.4 mmol/L — ABNORMAL LOW (ref 3.5–5.2)
Sodium: 141 mmol/L (ref 134–144)
Total Protein: 6.9 g/dL (ref 6.0–8.5)
eGFR: 64 mL/min/{1.73_m2} (ref >59)

## 2023-02-17 LAB — CBC WITH DIFFERENTIAL/PLATELET
Basophils Absolute: 0.1 10*3/uL (ref 0.0–0.2)
Basos: 1 %
EOS (ABSOLUTE): 0.4 10*3/uL (ref 0.0–0.4)
Eos: 6 %
Hematocrit: 44.9 % (ref 34.0–46.6)
Hemoglobin: 14.5 g/dL (ref 11.1–15.9)
Immature Grans (Abs): 0 10*3/uL (ref 0.0–0.1)
Immature Granulocytes: 0 %
Lymphocytes Absolute: 1.9 10*3/uL (ref 0.7–3.1)
Lymphs: 29 %
MCH: 27.8 pg (ref 26.6–33.0)
MCHC: 32.3 g/dL (ref 31.5–35.7)
MCV: 86 fL (ref 79–97)
Monocytes Absolute: 0.4 10*3/uL (ref 0.1–0.9)
Monocytes: 6 %
Neutrophils Absolute: 3.8 10*3/uL (ref 1.4–7.0)
Neutrophils: 58 %
Platelets: 351 10*3/uL (ref 150–450)
RBC: 5.22 x10E6/uL (ref 3.77–5.28)
RDW: 13.9 % (ref 11.7–15.4)
WBC: 6.7 10*3/uL (ref 3.4–10.8)

## 2023-02-17 LAB — TSH: TSH: 3.21 u[IU]/mL (ref 0.450–4.500)

## 2023-02-17 LAB — LIPID PANEL
Chol/HDL Ratio: 2.7 ratio (ref 0.0–4.4)
Cholesterol, Total: 204 mg/dL — ABNORMAL HIGH (ref 100–199)
HDL: 75 mg/dL (ref >39)
LDL Chol Calc (NIH): 105 mg/dL — ABNORMAL HIGH (ref 0–99)
Triglycerides: 137 mg/dL (ref 0–149)
VLDL Cholesterol Cal: 24 mg/dL (ref 5–40)

## 2023-02-17 LAB — HEPATITIS C ANTIBODY: Hep C Virus Ab: NONREACTIVE

## 2023-04-05 ENCOUNTER — Other Ambulatory Visit: Payer: Self-pay | Admitting: Internal Medicine

## 2023-04-05 DIAGNOSIS — I10 Essential (primary) hypertension: Secondary | ICD-10-CM

## 2023-04-08 LAB — FECAL OCCULT BLOOD, IMMUNOCHEMICAL

## 2023-04-12 NOTE — Progress Notes (Signed)
Called patient and left VM asking her to call office back to pick up new fecal occult stool kit.  - Marcella Dunnaway

## 2023-04-14 ENCOUNTER — Ambulatory Visit (INDEPENDENT_AMBULATORY_CARE_PROVIDER_SITE_OTHER): Payer: Medicare HMO

## 2023-04-14 VITALS — Ht 65.0 in | Wt 152.0 lb

## 2023-04-14 DIAGNOSIS — Z0000 Encounter for general adult medical examination without abnormal findings: Secondary | ICD-10-CM

## 2023-04-14 NOTE — Progress Notes (Signed)
Subjective:   Taylor Khan is a 67 y.o. female who presents for Medicare Annual (Subsequent) preventive examination.  Visit Complete: Virtual  I connected with  Taylor Khan on 04/14/23 by a audio enabled telemedicine application and verified that I am speaking with the correct person using two identifiers.  Patient Location: Home  Provider Location: Office/Clinic  I discussed the limitations of evaluation and management by telemedicine. The patient expressed understanding and agreed to proceed.  Vital Signs: Patient was unable to self-report vital signs via telehealth due to a lack of equipment at home.   Review of Systems     Cardiac Risk Factors include: advanced age (>56men, >61 women);sedentary lifestyle;dyslipidemia;hypertension     Objective:    Today's Vitals   04/14/23 1331 04/14/23 1345  Weight:  152 lb (68.9 kg)  Height:  5\' 5"  (1.651 m)  PainSc: 0-No pain    Body mass index is 25.29 kg/m.     04/14/2023    1:35 PM  Advanced Directives  Does Patient Have a Medical Advance Directive? No  Would patient like information on creating a medical advance directive? No - Patient declined    Current Medications (verified) Outpatient Encounter Medications as of 04/14/2023  Medication Sig   Ascorbic Acid (VITAMIN C PO) Take by mouth.   fexofenadine (ALLEGRA) 60 MG tablet Take 60 mg by mouth 2 (two) times daily.   hydrochlorothiazide (HYDRODIURIL) 50 MG tablet TAKE 1 TABLET(50 MG) BY MOUTH DAILY   ibuprofen (ADVIL) 600 MG tablet Take 1 tablet (600 mg total) by mouth every 8 (eight) hours as needed.   MAGNESIUM PO Take by mouth.   oxybutynin (DITROPAN XL) 5 MG 24 hr tablet Take 1 tablet (5 mg total) by mouth at bedtime.   potassium chloride SA (KLOR-CON M) 20 MEQ tablet TAKE 1 TABLET(20 MEQ) BY MOUTH DAILY   triamcinolone (NASACORT ALLERGY 24HR) 55 MCG/ACT AERO nasal inhaler Place 2 sprays into the nose daily.   VITAMIN D PO Take by mouth.   No  facility-administered encounter medications on file as of 04/14/2023.    Allergies (verified) Patient has no known allergies.   History: Past Medical History:  Diagnosis Date   Anxiety    Hypertension    Past Surgical History:  Procedure Laterality Date   TONSILLECTOMY     Family History  Problem Relation Age of Onset   Hypertension Mother    Heart disease Mother    Stroke Father    Heart disease Father    Hypertension Father    Diabetes Paternal Grandfather    Social History   Socioeconomic History   Marital status: Single    Spouse name: Not on file   Number of children: 1   Years of education: Not on file   Highest education level: Not on file  Occupational History   Occupation: Retired  Tobacco Use   Smoking status: Former    Current packs/day: 0.00    Average packs/day: 1 pack/day for 35.0 years (35.0 ttl pk-yrs)    Types: Cigarettes    Start date: 11/07/1984    Quit date: 11/08/2019    Years since quitting: 3.4   Smokeless tobacco: Not on file  Vaping Use   Vaping status: Never Used  Substance and Sexual Activity   Alcohol use: Never   Drug use: Never   Sexual activity: Not on file  Other Topics Concern   Not on file  Social History Narrative   Not on file  Social Determinants of Health   Financial Resource Strain: Low Risk  (04/14/2023)   Overall Financial Resource Strain (CARDIA)    Difficulty of Paying Living Expenses: Not very hard  Food Insecurity: No Food Insecurity (04/14/2023)   Hunger Vital Sign    Worried About Running Out of Food in the Last Year: Never true    Ran Out of Food in the Last Year: Never true  Transportation Needs: No Transportation Needs (04/14/2023)   PRAPARE - Administrator, Civil Service (Medical): No    Lack of Transportation (Non-Medical): No  Physical Activity: Inactive (04/14/2023)   Exercise Vital Sign    Days of Exercise per Week: 0 days    Minutes of Exercise per Session: 0 min  Stress: No Stress  Concern Present (04/14/2023)   [employer] of Occupational Health - Occupational Stress Questionnaire    Feeling of Stress : Not at all  Social Connections: Socially Isolated (04/14/2023)   Social Connection and Isolation Panel [NHANES]    Frequency of Communication with Friends and Family: Never    Frequency of Social Gatherings with Friends and Family: Never    Attends Religious Services: Never    Database administrator or Organizations: No    Attends Engineer, structural: Never    Marital Status: Divorced    Tobacco Counseling Counseling given: Not Answered   Clinical Intake:  Pre-visit preparation completed: Yes  Pain : No/denies pain Pain Score: 0-No pain     Nutritional Risks: None Diabetes: No  How often do you need to have someone help you when you read instructions, pamphlets, or other written materials from your doctor or pharmacy?: 1 - Never  Interpreter Needed?: No  Information entered by :: Taylor Bucker, LPN   Activities of Daily Living    04/14/2023    1:36 PM  In your present state of health, do you have any difficulty performing the following activities:  Hearing? 0  Vision? 0  Difficulty concentrating or making decisions? 0  Walking or climbing stairs? 0  Dressing or bathing? 0  Doing errands, shopping? 0  Preparing Food and eating ? N  Using the Toilet? N  In the past six months, have you accidently leaked urine? N  Do you have problems with loss of bowel control? N  Managing your Medications? N  Managing your Finances? N  Housekeeping or managing your Housekeeping? N    Patient Care Team: Reubin Milan, MD as PCP - General (Internal Medicine)  Indicate any recent Medical Services you may have received from other than [facility] providers in the past year (date may be approximate).     Assessment:   This is a routine wellness examination for Taylor Khan.  Hearing/Vision screen Hearing Screening - Comments:: No aids Vision  Screening - Comments:: Wears glasses- [facility]  Dietary issues and exercise activities discussed:     Goals Addressed             This Visit's Progress    DIET - EAT MORE FRUITS AND VEGETABLES         Depression Screen    04/14/2023    1:34 PM 02/16/2023   10:46 AM 08/14/2022    2:12 PM 04/08/2022    1:52 PM 02/10/2022    2:46 PM 01/09/2022    2:57 PM  PHQ 2/9 Scores  PHQ - 2 Score 0 2 3 2 2 2   PHQ- 9 Score 0 17 10 9 8  11  Fall Risk    04/14/2023    1:36 PM 02/16/2023   10:45 AM 08/14/2022    2:14 PM 04/08/2022    1:49 PM 02/10/2022    2:47 PM  Fall Risk   Falls in the past year? 1 0 0 0 0  Number falls in past yr: 0 0 0 0 0  Injury with Fall? 0 0 0 0 0  Risk for fall due to : History of fall(s) No Fall Risks No Fall Risks No Fall Risks No Fall Risks  Follow up Falls prevention discussed;Falls evaluation completed Falls evaluation completed Falls evaluation completed Falls evaluation completed Falls evaluation completed    MEDICARE RISK AT HOME:  Medicare Risk at Home - 04/14/23 1337     Any stairs in or around the home? Yes    If so, are there any without handrails? No    Home free of loose throw rugs in walkways, pet beds, electrical cords, etc? Yes    Adequate lighting in your home to reduce risk of falls? Yes    Life alert? No    Use of a cane, walker or w/c? No    Grab bars in the bathroom? Yes    Shower chair or bench in shower? No    Elevated toilet seat or a handicapped toilet? No             TIMED UP AND GO:  Was the test performed?  No    Cognitive Function:        04/14/2023    1:37 PM 04/08/2022    1:55 PM  6CIT Screen  What Year? 0 points 0 points  What month? 0 points 0 points  What time? 0 points 0 points  Count back from 20 0 points 0 points  Months in reverse 0 points 0 points  Repeat phrase 0 points 0 points  Total Score 0 points 0 points    Immunizations Immunization History  Administered Date(s) Administered    PNEUMOCOCCAL CONJUGATE-20 02/16/2023    TDAP status: Due, Education has been provided regarding the importance of this vaccine. Advised may receive this vaccine at local pharmacy or Health Dept. Aware to provide a copy of the vaccination record if obtained from local pharmacy or Health Dept. Verbalized acceptance and understanding.  Flu Vaccine status: Declined, Education has been provided regarding the importance of this vaccine but patient still declined. Advised may receive this vaccine at local pharmacy or Health Dept. Aware to provide a copy of the vaccination record if obtained from local pharmacy or Health Dept. Verbalized acceptance and understanding.  Pneumococcal vaccine status: Up to date  Covid-19 vaccine status: Declined, Education has been provided regarding the importance of this vaccine but patient still declined. Advised may receive this vaccine at local pharmacy or Health Dept.or vaccine clinic. Aware to provide a copy of the vaccination record if obtained from local pharmacy or Health Dept. Verbalized acceptance and understanding.  Qualifies for Shingles Vaccine? Yes   Zostavax completed No   Shingrix Completed?: No.    Education has been provided regarding the importance of this vaccine. Patient has been advised to call insurance company to determine out of pocket expense if they have not yet received this vaccine. Advised may also receive vaccine at local pharmacy or Health Dept. Verbalized acceptance and understanding.  Screening Tests Health Maintenance  Topic Date Due   DTaP/Tdap/Td (1 - Tdap) Never done   Colonoscopy  Never done   MAMMOGRAM  Never done  Zoster Vaccines- Shingrix (1 of 2) Never done   DEXA SCAN  Never done   Lung Cancer Screening  01/01/2021   COVID-19 Vaccine (1 - 2023-24 season) Never done   INFLUENZA VACCINE  04/15/2023   Medicare Annual Wellness (AWV)  04/13/2024   Pneumonia Vaccine 23+ Years old  Completed   Hepatitis C Screening  Completed    HPV VACCINES  Aged Out    Health Maintenance  Health Maintenance Due  Topic Date Due   DTaP/Tdap/Td (1 - Tdap) Never done   Colonoscopy  Never done   MAMMOGRAM  Never done   Zoster Vaccines- Shingrix (1 of 2) Never done   DEXA SCAN  Never done   Lung Cancer Screening  01/01/2021   COVID-19 Vaccine (1 - 2023-24 season) Never done    Declined referral for colonoscopy, mammogram, and BDS   Lung Cancer Screening: (Low Dose CT Chest recommended if Age 56-80 years, 20 pack-year currently smoking OR have quit w/in 15years.) does qualify.   Lung Cancer Screening Referral: 01/02/20- ordered 02/16/23  Additional Screening:  Hepatitis C Screening: does qualify; Completed 02/16/23  Vision Screening: Recommended annual ophthalmology exams for early detection of glaucoma and other disorders of the eye. Is the patient up to date with their annual eye exam?  Yes  Who is the provider or what is the name of the office in which the patient attends annual eye exams? [facility] If pt is not established with a provider, would they like to be referred to a provider to establish care? No .   Dental Screening: Recommended annual dental exams for proper oral hygiene   Community Resource Referral / Chronic Care Management: CRR required this visit?  No   CCM required this visit?  No     Plan:     I have personally reviewed and noted the following in the patient's chart:   Medical and social history Use of alcohol, tobacco or illicit drugs  Current medications and supplements including opioid prescriptions. Patient is not currently taking opioid prescriptions. Functional ability and status Nutritional status Physical activity Advanced directives List of other physicians Hospitalizations, surgeries, and ER visits in previous 12 months Vitals Screenings to include cognitive, depression, and falls Referrals and appointments  In addition, I have reviewed and discussed with patient certain  preventive protocols, quality metrics, and best practice recommendations. A written personalized care plan for preventive services as well as general preventive health recommendations were provided to patient.     Hal Hope, LPN   12/21/8117   After Visit Summary: (MyChart) Due to this being a telephonic visit, the after visit summary with patients personalized plan was offered to patient via MyChart   Nurse Notes: none

## 2023-04-14 NOTE — Patient Instructions (Addendum)
Taylor Khan , Thank you for taking time to come for your Medicare Wellness Visit. I appreciate your ongoing commitment to your health goals. Please review the following plan we discussed and let me know if I can assist you in the future.   Referrals/Orders/Follow-Ups/Clinician Recommendations: none  This is a list of the screening recommended for you and due dates:  Health Maintenance  Topic Date Due   DTaP/Tdap/Td vaccine (1 - Tdap) Never done   Colon Cancer Screening  Never done   Mammogram  Never done   Zoster (Shingles) Vaccine (1 of 2) Never done   DEXA scan (bone density measurement)  Never done   Screening for Lung Cancer  01/01/2021   COVID-19 Vaccine (1 - 2023-24 season) Never done   Flu Shot  04/15/2023   Medicare Annual Wellness Visit  04/13/2024   Pneumonia Vaccine  Completed   Hepatitis C Screening  Completed   HPV Vaccine  Aged Out    Advanced directives: (Declined) Advance directive discussed with you today. Even though you declined this today, please call our office should you change your mind, and we can give you the proper paperwork for you to fill out.  Next Medicare Annual Wellness Visit scheduled for next year: Yes   04/19/24 @ 10:45 am by phone  Preventive Care 65 Years and Older, Female Preventive care refers to lifestyle choices and visits with your health care provider that can promote health and wellness. What does preventive care include? A yearly physical exam. This is also called an annual well check. Dental exams once or twice a year. Routine eye exams. Ask your health care provider how often you should have your eyes checked. Personal lifestyle choices, including: Daily care of your teeth and gums. Regular physical activity. Eating a healthy diet. Avoiding tobacco and drug use. Limiting alcohol use. Practicing safe sex. Taking low-dose aspirin every day. Taking vitamin and mineral supplements as recommended by your health care provider. What happens  during an annual well check? The services and screenings done by your health care provider during your annual well check will depend on your age, overall health, lifestyle risk factors, and family history of disease. Counseling  Your health care provider may ask you questions about your: Alcohol use. Tobacco use. Drug use. Emotional well-being. Home and relationship well-being. Sexual activity. Eating habits. History of falls. Memory and ability to understand (cognition). Work and work Astronomer. Reproductive health. Screening  You may have the following tests or measurements: Height, weight, and BMI. Blood pressure. Lipid and cholesterol levels. These may be checked every 5 years, or more frequently if you are over 76 years old. Skin check. Lung cancer screening. You may have this screening every year starting at age 25 if you have a 30-pack-year history of smoking and currently smoke or have quit within the past 15 years. Fecal occult blood test (FOBT) of the stool. You may have this test every year starting at age 45. Flexible sigmoidoscopy or colonoscopy. You may have a sigmoidoscopy every 5 years or a colonoscopy every 10 years starting at age 90. Hepatitis C blood test. Hepatitis B blood test. Sexually transmitted disease (STD) testing. Diabetes screening. This is done by checking your blood sugar (glucose) after you have not eaten for a while (fasting). You may have this done every 1-3 years. Bone density scan. This is done to screen for osteoporosis. You may have this done starting at age 38. Mammogram. This may be done every 1-2 years. Talk to  your health care provider about how often you should have regular mammograms. Talk with your health care provider about your test results, treatment options, and if necessary, the need for more tests. Vaccines  Your health care provider may recommend certain vaccines, such as: Influenza vaccine. This is recommended every  year. Tetanus, diphtheria, and acellular pertussis (Tdap, Td) vaccine. You may need a Td booster every 10 years. Zoster vaccine. You may need this after age 68. Pneumococcal 13-valent conjugate (PCV13) vaccine. One dose is recommended after age 66. Pneumococcal polysaccharide (PPSV23) vaccine. One dose is recommended after age 58. Talk to your health care provider about which screenings and vaccines you need and how often you need them. This information is not intended to replace advice given to you by your health care provider. Make sure you discuss any questions you have with your health care provider. Document Released: 09/27/2015 Document Revised: 05/20/2016 Document Reviewed: 07/02/2015 Elsevier Interactive Patient Education  2017 [employer].  Fall Prevention in the Home Falls can cause injuries. They can happen to people of all ages. There are many things you can do to make your home safe and to help prevent falls. What can I do on the outside of my home? Regularly fix the edges of walkways and driveways and fix any cracks. Remove anything that might make you trip as you walk through a door, such as a raised step or threshold. Trim any bushes or trees on the path to your home. Use bright outdoor lighting. Clear any walking paths of anything that might make someone trip, such as rocks or tools. Regularly check to see if handrails are loose or broken. Make sure that both sides of any steps have handrails. Any raised decks and porches should have guardrails on the edges. Have any leaves, snow, or ice cleared regularly. Use sand or salt on walking paths during winter. Clean up any spills in your garage right away. This includes oil or grease spills. What can I do in the bathroom? Use night lights. Install grab bars by the toilet and in the tub and shower. Do not use towel bars as grab bars. Use non-skid mats or decals in the tub or shower. If you need to sit down in the shower, use a  plastic, non-slip stool. Keep the floor dry. Clean up any water that spills on the floor as soon as it happens. Remove soap buildup in the tub or shower regularly. Attach bath mats securely with double-sided non-slip rug tape. Do not have throw rugs and other things on the floor that can make you trip. What can I do in the bedroom? Use night lights. Make sure that you have a light by your bed that is easy to reach. Do not use any sheets or blankets that are too big for your bed. They should not hang down onto the floor. Have a firm chair that has side arms. You can use this for support while you get dressed. Do not have throw rugs and other things on the floor that can make you trip. What can I do in the kitchen? Clean up any spills right away. Avoid walking on wet floors. Keep items that you use a lot in easy-to-reach places. If you need to reach something above you, use a strong step stool that has a grab bar. Keep electrical cords out of the way. Do not use floor polish or wax that makes floors slippery. If you must use wax, use non-skid floor wax. Do not  have throw rugs and other things on the floor that can make you trip. What can I do with my stairs? Do not leave any items on the stairs. Make sure that there are handrails on both sides of the stairs and use them. Fix handrails that are broken or loose. Make sure that handrails are as long as the stairways. Check any carpeting to make sure that it is firmly attached to the stairs. Fix any carpet that is loose or worn. Avoid having throw rugs at the top or bottom of the stairs. If you do have throw rugs, attach them to the floor with carpet tape. Make sure that you have a light switch at the top of the stairs and the bottom of the stairs. If you do not have them, ask someone to add them for you. What else can I do to help prevent falls? Wear shoes that: Do not have high heels. Have rubber bottoms. Are comfortable and fit you  well. Are closed at the toe. Do not wear sandals. If you use a stepladder: Make sure that it is fully opened. Do not climb a closed stepladder. Make sure that both sides of the stepladder are locked into place. Ask someone to hold it for you, if possible. Clearly mark and make sure that you can see: Any grab bars or handrails. First and last steps. Where the edge of each step is. Use tools that help you move around (mobility aids) if they are needed. These include: Canes. Walkers. Scooters. Crutches. Turn on the lights when you go into a dark area. Replace any light bulbs as soon as they burn out. Set up your furniture so you have a clear path. Avoid moving your furniture around. If any of your floors are uneven, fix them. If there are any pets around you, be aware of where they are. Review your medicines with your doctor. Some medicines can make you feel dizzy. This can increase your chance of falling. Ask your doctor what other things that you can do to help prevent falls. This information is not intended to replace advice given to you by your health care provider. Make sure you discuss any questions you have with your health care provider. Document Released: 06/27/2009 Document Revised: 02/06/2016 Document Reviewed: 10/05/2014 Elsevier Interactive Patient Education  2017 [employer].

## 2023-05-14 ENCOUNTER — Other Ambulatory Visit: Payer: Self-pay | Admitting: Internal Medicine

## 2023-05-14 DIAGNOSIS — E876 Hypokalemia: Secondary | ICD-10-CM

## 2023-05-14 DIAGNOSIS — M545 Low back pain, unspecified: Secondary | ICD-10-CM

## 2023-05-14 NOTE — Telephone Encounter (Signed)
Requested Prescriptions  Pending Prescriptions Disp Refills   potassium chloride SA (KLOR-CON M) 20 MEQ tablet [Pharmacy Med Name: POTASSIUM CL ER TABLETS] 90 tablet 0    Sig: TAKE 1 TABLET(20 MEQ) BY MOUTH DAILY     Endocrinology:  Minerals - Potassium Supplementation Failed - 05/14/2023  3:16 AM      Failed - K in normal range and within 360 days    Potassium  Date Value Ref Range Status  02/16/2023 3.4 (L) 3.5 - 5.2 mmol/L Final         Passed - Cr in normal range and within 360 days    Creatinine, Ser  Date Value Ref Range Status  02/16/2023 0.97 0.57 - 1.00 mg/dL Final         Passed - Valid encounter within last 12 months    Recent Outpatient Visits           2 months ago Annual physical exam   [place] [facility] at [facility], Nyoka Cowden, MD   9 months ago Rib pain on right side   [facility] at [facility], Nyoka Cowden, MD   1 year ago Essential hypertension   [place] [facility] at [facility], Nyoka Cowden, MD   1 year ago Essential hypertension   [place] [facility] at [facility], Nyoka Cowden, MD       Future Appointments             In 1 month Judithann Graves, Nyoka Cowden, MD [facility] at [facility], [facility]

## 2023-05-14 NOTE — Telephone Encounter (Signed)
Requested medication (s) are due for refill today: Yes  Requested medication (s) are on the active medication list: Yes  Last refill:  02/16/23  Future visit scheduled: Yes  Notes to clinic:  Manual review.    Requested Prescriptions  Pending Prescriptions Disp Refills   ibuprofen (ADVIL) 600 MG tablet 60 tablet 0    Sig: Take 1 tablet (600 mg total) by mouth every 8 (eight) hours as needed.     Analgesics:  NSAIDS Failed - 05/14/2023  3:19 PM      Failed - Manual Review: Labs are only required if the patient has taken medication for more than 8 weeks.      Passed - Cr in normal range and within 360 days    Creatinine, Ser  Date Value Ref Range Status  02/16/2023 0.97 0.57 - 1.00 mg/dL Final         Passed - HGB in normal range and within 360 days    Hemoglobin  Date Value Ref Range Status  02/16/2023 14.5 11.1 - 15.9 g/dL Final         Passed - PLT in normal range and within 360 days    Platelets  Date Value Ref Range Status  02/16/2023 351 150 - 450 x10E3/uL Final         Passed - HCT in normal range and within 360 days    Hematocrit  Date Value Ref Range Status  02/16/2023 44.9 34.0 - 46.6 % Final         Passed - eGFR is 30 or above and within 360 days    eGFR  Date Value Ref Range Status  02/16/2023 64 >59 mL/min/1.73 Final         Passed - Patient is not pregnant      Passed - Valid encounter within last 12 months    Recent Outpatient Visits           2 months ago Annual physical exam   [place] [facility] at [facility], Nyoka Cowden, MD   9 months ago Rib pain on right side   [facility] at [facility], Nyoka Cowden, MD   1 year ago Essential hypertension   [place] [facility] at [facility], Nyoka Cowden, MD   1 year ago Essential hypertension   [place] [facility] at [facility], Nyoka Cowden, MD       Future  Appointments             In 1 month Judithann Graves, Nyoka Cowden, MD [facility] Medicine at [facility], [facility]

## 2023-05-14 NOTE — Telephone Encounter (Unsigned)
Copied from CRM [id number]. Topic: General - Other >> May 14, 2023  3:03 PM Everette C wrote: Reason for CRM: Medication Refill - Medication: ibuprofen (ADVIL) 600 MG tablet [[id number]]  Has the patient contacted their pharmacy? Yes.   (Agent: If no, request that the patient contact the pharmacy for the refill. If patient does not wish to contact the pharmacy document the reason why and proceed with request.) (Agent: If yes, when and what did the pharmacy advise?)  Preferred Pharmacy (with phone number or street name): [id number] [place], [place] - [address] AT [facility] OF [address] [place] [address] Phone: [phone] Fax: [phone] Hours: Not open 24 hours   Has the patient been seen for an appointment in the last year OR does the patient have an upcoming appointment? Yes.    Agent: Please be advised that RX refills may take up to 3 business days. We ask that you follow-up with your pharmacy.

## 2023-05-18 ENCOUNTER — Other Ambulatory Visit: Payer: Self-pay | Admitting: Internal Medicine

## 2023-05-18 DIAGNOSIS — M545 Low back pain, unspecified: Secondary | ICD-10-CM

## 2023-05-18 MED ORDER — IBUPROFEN 600 MG PO TABS
600.0000 mg | ORAL_TABLET | Freq: Three times a day (TID) | ORAL | 0 refills | Status: AC | PRN
Start: 2023-05-18 — End: ?

## 2023-06-16 ENCOUNTER — Encounter: Payer: Self-pay | Admitting: Internal Medicine

## 2023-06-16 ENCOUNTER — Ambulatory Visit (INDEPENDENT_AMBULATORY_CARE_PROVIDER_SITE_OTHER): Payer: Medicare HMO | Admitting: Internal Medicine

## 2023-06-16 VITALS — BP 118/74 | HR 65 | Ht 65.0 in | Wt 140.4 lb

## 2023-06-16 DIAGNOSIS — Z1211 Encounter for screening for malignant neoplasm of colon: Secondary | ICD-10-CM | POA: Diagnosis not present

## 2023-06-16 DIAGNOSIS — F39 Unspecified mood [affective] disorder: Secondary | ICD-10-CM | POA: Insufficient documentation

## 2023-06-16 DIAGNOSIS — M545 Low back pain, unspecified: Secondary | ICD-10-CM | POA: Diagnosis not present

## 2023-06-16 DIAGNOSIS — I10 Essential (primary) hypertension: Secondary | ICD-10-CM | POA: Diagnosis not present

## 2023-06-16 DIAGNOSIS — F17201 Nicotine dependence, unspecified, in remission: Secondary | ICD-10-CM | POA: Diagnosis not present

## 2023-06-16 DIAGNOSIS — J432 Centrilobular emphysema: Secondary | ICD-10-CM

## 2023-06-16 DIAGNOSIS — E279 Disorder of adrenal gland, unspecified: Secondary | ICD-10-CM

## 2023-06-16 DIAGNOSIS — R1312 Dysphagia, oropharyngeal phase: Secondary | ICD-10-CM | POA: Diagnosis not present

## 2023-06-16 MED ORDER — IBUPROFEN 600 MG PO TABS: 600.0000 mg | ORAL_TABLET | Freq: Every day | ORAL | 1 refills | Status: DC

## 2023-06-16 NOTE — Assessment & Plan Note (Signed)
MRI 2023: IMPRESSION: 1. 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 2. Small hiatal hernia. 3. Cholelithiasis.  No further work up is needed.

## 2023-06-16 NOTE — Assessment & Plan Note (Signed)
Normal exam with stable BP on hctz. No concerns or side effects to current medication. No change in regimen; continue low sodium diet.

## 2023-06-16 NOTE — Progress Notes (Signed)
Date:  06/16/2023   Name:  Taylor Khan   DOB:  1955-10-25   MRN:  [id number]   Chief Complaint: Hypertension and Dysphagia (Difficulty swallowing the potassium pill because its so large.)  Hypertension This is a chronic problem. The problem is controlled. Pertinent negatives include no chest pain, headaches, palpitations, peripheral edema or shortness of breath. Past treatments include diuretics. The current treatment provides significant improvement. There are no compliance problems.  There is no history of kidney disease, CAD/MI or CVA.  Dysphagia - no recent episodes; she thinks maybe the potassium pill was the culprit.  Lab Results  Component Value Date   NA 141 02/16/2023   K 3.4 (L) 02/16/2023   CO2 28 02/16/2023   GLUCOSE 92 02/16/2023   BUN 16 02/16/2023   CREATININE 0.97 02/16/2023   CALCIUM 10.1 02/16/2023   EGFR 64 02/16/2023   Lab Results  Component Value Date   CHOL 204 (H) 02/16/2023   HDL 75 02/16/2023   LDLCALC 105 (H) 02/16/2023   TRIG 137 02/16/2023   CHOLHDL 2.7 02/16/2023   Lab Results  Component Value Date   TSH 3.210 02/16/2023   Lab Results  Component Value Date   HGBA1C 5.7 12/26/2019   Lab Results  Component Value Date   WBC 6.7 02/16/2023   HGB 14.5 02/16/2023   HCT 44.9 02/16/2023   MCV 86 02/16/2023   PLT 351 02/16/2023   Lab Results  Component Value Date   ALT 13 02/16/2023   AST 25 02/16/2023   ALKPHOS 116 02/16/2023   BILITOT 0.4 02/16/2023   No results found for: "25OHVITD2", "25OHVITD3", "VD25OH"   Review of Systems  Constitutional:  Negative for fatigue and unexpected weight change.  HENT:  Negative for nosebleeds.   Eyes:  Negative for visual disturbance.  Respiratory:  Negative for cough, chest tightness, shortness of breath and wheezing.   Cardiovascular:  Negative for chest pain, palpitations and leg swelling.  Gastrointestinal:  Negative for abdominal pain, constipation and diarrhea.  Neurological:  Negative for  dizziness, weakness, light-headedness and headaches.    Patient Active Problem List   Diagnosis Date Noted   Mixed hyperlipidemia 02/16/2023   Lumbar back pain 02/16/2023   Bilateral hearing loss 02/16/2023   Dysphagia 02/16/2023   Essential hypertension 01/09/2022   Centrilobular emphysema (HCC) 01/09/2022   Adrenal nodule (HCC) 01/09/2022   Environmental and seasonal allergies 01/09/2022   OAB (overactive bladder) 01/09/2022   Tobacco use disorder, severe, in early remission 01/09/2022    No Known Allergies  Past Surgical History:  Procedure Laterality Date   TONSILLECTOMY      Social History   Tobacco Use   Smoking status: Former    Current packs/day: 0.00    Average packs/day: 1 pack/day for 35.0 years (35.0 ttl pk-yrs)    Types: Cigarettes    Start date: 11/07/1984    Quit date: 11/08/2019    Years since quitting: 3.6  Vaping Use   Vaping status: Never Used  Substance Use Topics   Alcohol use: Never   Drug use: Never     Medication list has been reviewed and updated.  Current Meds  Medication Sig   Ascorbic Acid (VITAMIN C PO) Take by mouth.   hydrochlorothiazide (HYDRODIURIL) 50 MG tablet TAKE 1 TABLET(50 MG) BY MOUTH DAILY   MAGNESIUM PO Take by mouth.   oxybutynin (DITROPAN XL) 5 MG 24 hr tablet Take 1 tablet (5 mg total) by mouth at bedtime.  potassium chloride SA (KLOR-CON M) 20 MEQ tablet TAKE 1 TABLET(20 MEQ) BY MOUTH DAILY   triamcinolone (NASACORT ALLERGY 24HR) 55 MCG/ACT AERO nasal inhaler Place 2 sprays into the nose daily.   VITAMIN D PO Take by mouth.   [DISCONTINUED] ibuprofen (ADVIL) 600 MG tablet Take 1 tablet (600 mg total) by mouth every 8 (eight) hours as needed.       02/16/2023   10:46 AM 08/14/2022    2:13 PM 02/10/2022    2:46 PM 01/09/2022    2:58 PM  GAD 7 : Generalized Anxiety Score  Nervous, Anxious, on Edge 0 0 0 0  Control/stop worrying 0 0 0 0  Worry too much - different things 0 1 0 0  Trouble relaxing 0 0 0 0  Restless  0 0 0 0  Easily annoyed or irritable 0 0 0 0  Afraid - awful might happen 0 0 0 0  Total GAD 7 Score 0 1 0 0  Anxiety Difficulty Not difficult at all Not difficult at all Not difficult at all        04/14/2023    1:34 PM 02/16/2023   10:46 AM 08/14/2022    2:12 PM  Depression screen PHQ 2/9  Decreased Interest 0 2 1  Down, Depressed, Hopeless 0 0 2  PHQ - 2 Score 0 2 3  Altered sleeping 0 3 3  Tired, decreased energy 0 3 3  Change in appetite 0 1 0  Feeling bad or failure about yourself  0 3 0  Trouble concentrating 0 3 1  Moving slowly or fidgety/restless 0 2 0  Suicidal thoughts 0 0 0  PHQ-9 Score 0 17 10  Difficult doing work/chores Not difficult at all Somewhat difficult Somewhat difficult    BP Readings from Last 3 Encounters:  06/16/23 118/74  02/16/23 128/72  08/14/22 118/80    Physical Exam Vitals and nursing note reviewed.  Constitutional:      General: She is not in acute distress.    Appearance: She is well-developed.  HENT:     Head: Normocephalic and atraumatic.  Pulmonary:     Effort: Pulmonary effort is normal. No respiratory distress.  Skin:    General: Skin is warm and dry.     Findings: No rash.  Neurological:     Mental Status: She is alert and oriented to person, place, and time.  Psychiatric:        Mood and Affect: Mood normal.        Behavior: Behavior normal.     Wt Readings from Last 3 Encounters:  06/16/23 140 lb 6.4 oz (63.7 kg)  04/14/23 152 lb (68.9 kg)  02/16/23 152 lb (68.9 kg)    BP 118/74   Pulse 65   Ht 5\' 5"  (1.651 m)   Wt 140 lb 6.4 oz (63.7 kg)   SpO2 98%   BMI 23.36 kg/m   Assessment and Plan:  Problem List Items Addressed This Visit       Unprioritized   Adrenal nodule (HCC) (Chronic)    MRI 2023: IMPRESSION: 1. 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 2. Small hiatal hernia. 3. Cholelithiasis.  No further work up is needed.      Centrilobular emphysema (HCC) (Chronic)   Dysphagia     Discussed last visit - intermittent symptoms requiring regurgitation of food. Has not seen GI for evaluation.  She now thinks it was the potassium pill and she had made some dosing changes without  any new episodes. Not taking a PPI or H2b; intermittent use of NSAIDS Will continue to monitor for worsening.      Essential hypertension - Primary (Chronic)    Normal exam with stable BP on hctz. No concerns or side effects to current medication. No change in regimen; continue low sodium diet.       Lumbar back pain    Recent imaging shows only DDD Managing well with Advil 600 mg once a day as needed.      Relevant Medications   ibuprofen (ADVIL) 600 MG tablet   Tobacco use disorder, severe, in early remission   Relevant Orders   Ambulatory Referral Lung Cancer Screening  Pulmonary   Other Visit Diagnoses     Colon cancer screening       will repeat FIT test   Relevant Orders   Fecal occult blood, imunochemical       Return in about 8 months (around 02/14/2024) for HTN.    Reubin Milan, MD [facility] [place]

## 2023-06-16 NOTE — Assessment & Plan Note (Addendum)
Discussed last visit - intermittent symptoms requiring regurgitation of food. Has not seen GI for evaluation.  She now thinks it was the potassium pill and she had made some dosing changes without any new episodes. Not taking a PPI or H2b; intermittent use of NSAIDS Will continue to monitor for worsening.

## 2023-06-16 NOTE — Assessment & Plan Note (Signed)
Recent imaging shows only DDD Managing well with Advil 600 mg once a day as needed.

## 2023-06-21 DIAGNOSIS — Z1211 Encounter for screening for malignant neoplasm of colon: Secondary | ICD-10-CM | POA: Diagnosis not present

## 2023-06-25 LAB — FECAL OCCULT BLOOD, IMMUNOCHEMICAL: Fecal Occult Bld: NEGATIVE

## 2023-08-14 ENCOUNTER — Other Ambulatory Visit: Payer: Self-pay | Admitting: Internal Medicine

## 2023-08-14 DIAGNOSIS — N3281 Overactive bladder: Secondary | ICD-10-CM

## 2023-08-17 NOTE — Telephone Encounter (Signed)
Requested Prescriptions  Pending Prescriptions Disp Refills   oxybutynin (DITROPAN-XL) 5 MG 24 hr tablet [Pharmacy Med Name: OXYBUTYNIN ER 5MG  TABLETS] 90 tablet 3    Sig: TAKE 1 TABLET(5 MG) BY MOUTH AT BEDTIME     Urology:  Bladder Agents Passed - 08/14/2023  3:15 AM      Passed - Valid encounter within last 12 months    Recent Outpatient Visits           2 months ago Essential hypertension   [place] [facility] at [facility], Nyoka Cowden, MD   6 months ago Annual physical exam   [facility] at [facility], Nyoka Cowden, MD   1 year ago Rib pain on right side   [facility] at [facility], Nyoka Cowden, MD   1 year ago Essential hypertension   [place] [facility] at [facility], Nyoka Cowden, MD   1 year ago Essential hypertension   [place] [facility] at [facility], Nyoka Cowden, MD       Future Appointments             In 5 months Judithann Graves, Nyoka Cowden, MD [facility] at [facility], [facility]

## 2023-09-10 ENCOUNTER — Other Ambulatory Visit: Payer: Self-pay

## 2023-09-10 DIAGNOSIS — Z122 Encounter for screening for malignant neoplasm of respiratory organs: Secondary | ICD-10-CM

## 2023-09-10 DIAGNOSIS — Z87891 Personal history of nicotine dependence: Secondary | ICD-10-CM

## 2023-09-20 ENCOUNTER — Ambulatory Visit (INDEPENDENT_AMBULATORY_CARE_PROVIDER_SITE_OTHER): Payer: Medicare HMO | Admitting: Acute Care

## 2023-09-20 DIAGNOSIS — Z87891 Personal history of nicotine dependence: Secondary | ICD-10-CM

## 2023-09-20 NOTE — Progress Notes (Signed)
 Provider Attestation I agree with the documentation of the Shared Decision Making visit,  smoking cessation counseling if appropriate, and verification or eligibility for lung cancer screening as documented by the RN Nurse Navigator.   Raejean Bullock, MSN, AGACNP-BC [facility] See Amion for personal pager PCCM on call pager [phone]    Virtual Visit via Telephone Note  I connected with Gordon Latus on 09/20/23 at  3:00 PM EST by telephone and verified that I am speaking with the correct person using two identifiers.  Location: Patient: In home Provider: [address] [place], [place], [address]    Shared Decision Making Visit Lung Cancer Screening Program [phone])   Eligibility: Age 68 y.o. Pack Years Smoking History Calculation 36 (# packs/per year x # years smoked) Recent History of coughing up blood  no Unexplained weight loss? no ( >Than 15 pounds within the last 6 months ) Prior History Lung / other cancer no (Diagnosis within the last 5 years already requiring surveillance chest CT Scans). Smoking Status Former Smoker Former Smokers: Years since quit: 3 years  Quit Date: 11-08-19  Visit Components: Discussion included one or more decision making aids. yes Discussion included risk/benefits of screening. yes Discussion included potential follow up diagnostic testing for abnormal scans. yes Discussion included meaning and risk of over diagnosis. yes Discussion included meaning and risk of False Positives. yes Discussion included meaning of total radiation exposure. yes  Counseling Included: Importance of adherence to annual lung cancer LDCT screening. yes Impact of comorbidities on ability to participate in the program. yes Ability and willingness to under diagnostic treatment. yes  Smoking Cessation Counseling: Current Smokers:  Discussed importance of smoking cessation. yes Information about tobacco cessation classes and  interventions provided to patient. yes Patient provided with "ticket" for LDCT Scan. yes Symptomatic Patient. no  Counseling NA Diagnosis Code: Tobacco Use Z72.0 Asymptomatic Patient yes  Counseling (Intermediate counseling: > three minutes counseling) [id number] Former Smokers:  Discussed the importance of maintaining cigarette abstinence. yes Diagnosis Code: Personal History of Nicotine Dependence. O13.086 Information about tobacco cessation classes and interventions provided to patient. Yes Patient provided with "ticket" for LDCT Scan. yes Written Order for Lung Cancer Screening with LDCT placed in Epic. Yes (CT Chest Lung Cancer Screening Low Dose W/O CM) [id number] Z12.2-Screening of respiratory organs Z87.891-Personal history of nicotine dependence   Valentin Gaskins, RN 09/20/23

## 2023-09-20 NOTE — Patient Instructions (Signed)

## 2023-10-12 ENCOUNTER — Other Ambulatory Visit: Payer: Self-pay | Admitting: Internal Medicine

## 2023-10-12 DIAGNOSIS — E876 Hypokalemia: Secondary | ICD-10-CM

## 2023-10-12 NOTE — Telephone Encounter (Signed)
Medication Refill -  Most Recent Primary Care Visit:  Provider: Reubin Milan  Department: [facility]  Visit Type: OFFICE VISIT  Date: 06/16/2023  Medication: potassium  Has the patient contacted their pharmacy? Yes (Agent: If no, request that the patient contact the pharmacy for the refill. If patient does not wish to contact the pharmacy document the reason why and proceed with request.) (Agent: If yes, when and what did the pharmacy advise?)  Is this the correct pharmacy for this prescription? Yes If no, delete pharmacy and type the correct one.  This is the patient's preferred pharmacy:  [id number] - [place], [place] - [address] AT [facility] [address] [place] [address] [place] [address] Phone: [phone] Fax: [phone]   Has the prescription been filled recently? No  Is the patient out of the medication? No  Has the patient been seen for an appointment in the last year OR does the patient have an upcoming appointment? Yes  Can we respond through MyChart? No  Agent: Please be advised that Rx refills may take up to 3 business days. We ask that you follow-up with your pharmacy.

## 2023-10-14 MED ORDER — POTASSIUM CHLORIDE CRYS ER 20 MEQ PO TBCR: 20.0000 meq | EXTENDED_RELEASE_TABLET | Freq: Every day | ORAL | 0 refills | Status: DC

## 2023-10-14 NOTE — Telephone Encounter (Signed)
Requested Prescriptions  Pending Prescriptions Disp Refills   potassium chloride SA (KLOR-CON M) 20 MEQ tablet 90 tablet 0    Sig: Take 1 tablet (20 mEq total) by mouth daily.     Endocrinology:  Minerals - Potassium Supplementation Failed - 10/14/2023 10:26 AM      Failed - K in normal range and within 360 days    Potassium  Date Value Ref Range Status  02/16/2023 3.4 (L) 3.5 - 5.2 mmol/L Final         Passed - Cr in normal range and within 360 days    Creatinine, Ser  Date Value Ref Range Status  02/16/2023 0.97 0.57 - 1.00 mg/dL Final         Passed - Valid encounter within last 12 months    Recent Outpatient Visits           4 months ago Essential hypertension   [place] [facility] at [facility], Nyoka Cowden, MD   8 months ago Annual physical exam   [facility] at [facility], Nyoka Cowden, MD   1 year ago Rib pain on right side   [facility] at [facility], Nyoka Cowden, MD   1 year ago Essential hypertension   [place] [facility] at [facility], Nyoka Cowden, MD   1 year ago Essential hypertension   [place] [facility] at [facility], Nyoka Cowden, MD       Future Appointments             In 3 months Judithann Graves, Nyoka Cowden, MD [facility] at [facility], [facility]

## 2023-10-15 ENCOUNTER — Ambulatory Visit: Payer: Medicare HMO

## 2024-01-11 ENCOUNTER — Other Ambulatory Visit: Payer: Self-pay | Admitting: Internal Medicine

## 2024-01-11 DIAGNOSIS — E876 Hypokalemia: Secondary | ICD-10-CM

## 2024-01-13 NOTE — Telephone Encounter (Signed)
 Last OV 06/16/23 Requested Prescriptions  Pending Prescriptions Disp Refills   potassium chloride  SA (KLOR-CON  M) 20 MEQ tablet [Pharmacy Med Name: POTASSIUM CL ER TABLETS] 90 tablet 0    Sig: TAKE 1 TABLET(20 MEQ) BY MOUTH DAILY     Endocrinology:  Minerals - Potassium Supplementation Failed - 01/13/2024 11:46 AM      Failed - K in normal range and within 360 days    Potassium  Date Value Ref Range Status  02/16/2023 3.4 (L) 3.5 - 5.2 mmol/L Final         Failed - Valid encounter within last 12 months    Recent Outpatient Visits   None     Future Appointments             In 1 week Sheron Dixons, MD [facility] at [facility], [facility]            Passed - Cr in normal range and within 360 days    Creatinine, Ser  Date Value Ref Range Status  02/16/2023 0.97 0.57 - 1.00 mg/dL Final

## 2024-01-26 ENCOUNTER — Ambulatory Visit (INDEPENDENT_AMBULATORY_CARE_PROVIDER_SITE_OTHER): Payer: Self-pay | Admitting: Internal Medicine

## 2024-01-26 ENCOUNTER — Encounter: Payer: Self-pay | Admitting: Internal Medicine

## 2024-01-26 VITALS — BP 118/72 | HR 77 | Ht 65.0 in | Wt 145.1 lb

## 2024-01-26 DIAGNOSIS — M545 Low back pain, unspecified: Secondary | ICD-10-CM

## 2024-01-26 DIAGNOSIS — I10 Essential (primary) hypertension: Secondary | ICD-10-CM

## 2024-01-26 DIAGNOSIS — N3281 Overactive bladder: Secondary | ICD-10-CM

## 2024-01-26 DIAGNOSIS — E782 Mixed hyperlipidemia: Secondary | ICD-10-CM | POA: Diagnosis not present

## 2024-01-26 DIAGNOSIS — J432 Centrilobular emphysema: Secondary | ICD-10-CM

## 2024-01-26 DIAGNOSIS — Z791 Long term (current) use of non-steroidal anti-inflammatories (NSAID): Secondary | ICD-10-CM

## 2024-01-26 MED ORDER — IBUPROFEN 600 MG PO TABS
600.0000 mg | ORAL_TABLET | Freq: Every day | ORAL | 1 refills | Status: DC
Start: 2024-01-26 — End: 2024-07-18

## 2024-01-26 NOTE — Assessment & Plan Note (Signed)
 Minimally symptomatic. Continues tobacco free. She tried to do LDCT but needed $100 and can not afford it

## 2024-01-26 NOTE — Assessment & Plan Note (Signed)
 Doing well on Ditropan  daily. Will check UA

## 2024-01-26 NOTE — Assessment & Plan Note (Signed)
 Chronic low back pain with intermittent flares Use Ibuprofen  as needed.

## 2024-01-26 NOTE — Progress Notes (Signed)
 Date:  01/26/2024   Name:  Taylor Khan   DOB:  31-Oct-1955   MRN:  [id number]   Chief Complaint: Hypertension  Hypertension This is a chronic problem. The problem is controlled. Pertinent negatives include no chest pain, headaches, palpitations or shortness of breath. Past treatments include diuretics. The current treatment provides significant improvement.  Back Pain This is a recurrent problem. The problem has been waxing and waning since onset. The pain is present in the lumbar spine. Pertinent negatives include no abdominal pain, chest pain, fever or headaches. She has tried NSAIDs for the symptoms. The treatment provided significant relief.  Urinary Frequency  This is a recurrent problem. The problem has been unchanged. Associated symptoms include frequency. Pertinent negatives include no chills. Treatments tried: ditropan .    Review of Systems  Constitutional:  Negative for chills, fatigue and fever.  Respiratory:  Negative for cough, chest tightness, shortness of breath and wheezing.   Cardiovascular:  Negative for chest pain, palpitations and leg swelling.  Gastrointestinal:  Negative for abdominal pain.  Genitourinary:  Positive for frequency.  Musculoskeletal:  Positive for back pain.  Neurological:  Negative for dizziness and headaches.  Psychiatric/Behavioral:  Negative for dysphoric mood and sleep disturbance. The patient is not nervous/anxious.      Lab Results  Component Value Date   NA 141 02/16/2023   K 3.4 (L) 02/16/2023   CO2 28 02/16/2023   GLUCOSE 92 02/16/2023   BUN 16 02/16/2023   CREATININE 0.97 02/16/2023   CALCIUM 10.1 02/16/2023   EGFR 64 02/16/2023   Lab Results  Component Value Date   CHOL 204 (H) 02/16/2023   HDL 75 02/16/2023   LDLCALC 105 (H) 02/16/2023   TRIG 137 02/16/2023   CHOLHDL 2.7 02/16/2023   Lab Results  Component Value Date   TSH 3.210 02/16/2023   Lab Results  Component Value Date   HGBA1C 5.7 12/26/2019   Lab Results   Component Value Date   WBC 6.7 02/16/2023   HGB 14.5 02/16/2023   HCT 44.9 02/16/2023   MCV 86 02/16/2023   PLT 351 02/16/2023   Lab Results  Component Value Date   ALT 13 02/16/2023   AST 25 02/16/2023   ALKPHOS 116 02/16/2023   BILITOT 0.4 02/16/2023   No results found for: "25OHVITD2", "25OHVITD3", "VD25OH"   Patient Active Problem List   Diagnosis Date Noted   Mixed hyperlipidemia 02/16/2023   Lumbar back pain 02/16/2023   Bilateral hearing loss 02/16/2023   Dysphagia 02/16/2023   Essential hypertension 01/09/2022   Centrilobular emphysema (HCC) 01/09/2022   Adrenal nodule (HCC) 01/09/2022   Environmental and seasonal allergies 01/09/2022   OAB (overactive bladder) 01/09/2022   Tobacco use disorder, severe, in early remission 01/09/2022    No Known Allergies  Past Surgical History:  Procedure Laterality Date   TONSILLECTOMY      Social History   Tobacco Use   Smoking status: Former    Current packs/day: 0.00    Average packs/day: 1 pack/day for 35.0 years (35.0 ttl pk-yrs)    Types: Cigarettes    Start date: 11/07/1984    Quit date: 11/08/2019    Years since quitting: 4.2  Vaping Use   Vaping status: Never Used  Substance Use Topics   Alcohol use: Never   Drug use: Never     Medication list has been reviewed and updated.  Current Meds  Medication Sig   Ascorbic Acid (VITAMIN C PO) Take by mouth.  hydrochlorothiazide  (HYDRODIURIL ) 50 MG tablet TAKE 1 TABLET(50 MG) BY MOUTH DAILY   MAGNESIUM PO Take by mouth.   oxybutynin  (DITROPAN -XL) 5 MG 24 hr tablet TAKE 1 TABLET(5 MG) BY MOUTH AT BEDTIME   potassium chloride  SA (KLOR-CON  M) 20 MEQ tablet TAKE 1 TABLET(20 MEQ) BY MOUTH DAILY   triamcinolone (NASACORT ALLERGY 24HR) 55 MCG/ACT AERO nasal inhaler Place 2 sprays into the nose daily.   VITAMIN D PO Take by mouth.   [DISCONTINUED] ibuprofen  (ADVIL ) 600 MG tablet Take 1 tablet (600 mg total) by mouth daily.       01/26/2024    2:30 PM 02/16/2023    10:46 AM 08/14/2022    2:13 PM 02/10/2022    2:46 PM  GAD 7 : Generalized Anxiety Score  Nervous, Anxious, on Edge 0 0 0 0  Control/stop worrying 0 0 0 0  Worry too much - different things 0 0 1 0  Trouble relaxing 0 0 0 0  Restless 0 0 0 0  Easily annoyed or irritable 0 0 0 0  Afraid - awful might happen 0 0 0 0  Total GAD 7 Score 0 0 1 0  Anxiety Difficulty Not difficult at all Not difficult at all Not difficult at all Not difficult at all       01/26/2024    2:28 PM 04/14/2023    1:34 PM 02/16/2023   10:46 AM  Depression screen PHQ 2/9  Decreased Interest 0 0 2  Down, Depressed, Hopeless 0 0 0  PHQ - 2 Score 0 0 2  Altered sleeping 0 0 3  Tired, decreased energy 0 0 3  Change in appetite 0 0 1  Feeling bad or failure about yourself  0 0 3  Trouble concentrating 0 0 3  Moving slowly or fidgety/restless 0 0 2  Suicidal thoughts 0 0 0  PHQ-9 Score 0 0 17  Difficult doing work/chores Not difficult at all Not difficult at all Somewhat difficult    BP Readings from Last 3 Encounters:  01/26/24 118/72  06/16/23 118/74  02/16/23 128/72    Physical Exam Vitals and nursing note reviewed.  Constitutional:      General: She is not in acute distress.    Appearance: Normal appearance. She is well-developed.  HENT:     Head: Normocephalic and atraumatic.  Neck:     Vascular: No carotid bruit.  Cardiovascular:     Rate and Rhythm: Normal rate and regular rhythm.  Pulmonary:     Effort: Pulmonary effort is normal. No respiratory distress.     Breath sounds: No wheezing or rhonchi.  Musculoskeletal:     Cervical back: Normal range of motion.     Right lower leg: No edema.     Left lower leg: No edema.  Lymphadenopathy:     Cervical: No cervical adenopathy.  Skin:    General: Skin is warm and dry.     Capillary Refill: Capillary refill takes less than 2 seconds.     Findings: No rash.  Neurological:     General: No focal deficit present.     Mental Status: She is alert  and oriented to person, place, and time.  Psychiatric:        Mood and Affect: Mood normal.        Behavior: Behavior normal.     Wt Readings from Last 3 Encounters:  01/26/24 145 lb 2 oz (65.8 kg)  06/16/23 140 lb 6.4 oz (63.7 kg)  04/14/23  152 lb (68.9 kg)    BP 118/72   Pulse 77   Ht 5\' 5"  (1.651 m)   Wt 145 lb 2 oz (65.8 kg)   SpO2 95%   BMI 24.15 kg/m   Assessment and Plan:  Problem List Items Addressed This Visit       Unprioritized   Essential hypertension - Primary (Chronic)   Blood pressure is well controlled.  Current medications are hydrochlorothiazide  Will continue same regimen along with efforts to limit dietary sodium.       Relevant Orders   CBC with Differential/Platelet   TSH   Urinalysis, Routine w reflex microscopic   Centrilobular emphysema (HCC) (Chronic)   Minimally symptomatic. Continues tobacco free. She tried to do LDCT but needed $100 and can not afford it      OAB (overactive bladder) (Chronic)   Doing well on Ditropan  daily. Will check UA      Mixed hyperlipidemia   Managed with diet alone  Lab Results  Component Value Date   LDLCALC 105 (H) 02/16/2023         Lumbar back pain   Chronic low back pain with intermittent flares Use Ibuprofen  as needed.      Relevant Medications   ibuprofen  (ADVIL ) 600 MG tablet   Other Visit Diagnoses       Encounter for long-term (current) use of NSAIDs       Relevant Orders   Comprehensive metabolic panel with GFR       Return in about 6 months (around 07/28/2024) for HTN.    Sheron Dixons, MD [facility] [place]

## 2024-01-26 NOTE — Assessment & Plan Note (Signed)
 Managed with diet alone  Lab Results  Component Value Date   LDLCALC 105 (H) 02/16/2023

## 2024-01-26 NOTE — Assessment & Plan Note (Signed)
 Blood pressure is well controlled.  Current medications are hydrochlorothiazide . Will continue same regimen along with efforts to limit dietary sodium.

## 2024-01-27 ENCOUNTER — Ambulatory Visit: Payer: Self-pay | Admitting: Internal Medicine

## 2024-01-27 LAB — CBC WITH DIFFERENTIAL/PLATELET
Basophils Absolute: 0.1 10*3/uL (ref 0.0–0.2)
Basos: 1 %
EOS (ABSOLUTE): 0.4 10*3/uL (ref 0.0–0.4)
Eos: 5 %
Hematocrit: 47.5 % — ABNORMAL HIGH (ref 34.0–46.6)
Hemoglobin: 15.4 g/dL (ref 11.1–15.9)
Immature Grans (Abs): 0 10*3/uL (ref 0.0–0.1)
Immature Granulocytes: 0 %
Lymphocytes Absolute: 1.7 10*3/uL (ref 0.7–3.1)
Lymphs: 21 %
MCH: 28.5 pg (ref 26.6–33.0)
MCHC: 32.4 g/dL (ref 31.5–35.7)
MCV: 88 fL (ref 79–97)
Monocytes Absolute: 0.7 10*3/uL (ref 0.1–0.9)
Monocytes: 8 %
Neutrophils Absolute: 5.5 10*3/uL (ref 1.4–7.0)
Neutrophils: 65 %
Platelets: 347 10*3/uL (ref 150–450)
RBC: 5.4 x10E6/uL — ABNORMAL HIGH (ref 3.77–5.28)
RDW: 14.4 % (ref 11.7–15.4)
WBC: 8.3 10*3/uL (ref 3.4–10.8)

## 2024-01-27 LAB — COMPREHENSIVE METABOLIC PANEL WITH GFR
ALT: 13 IU/L (ref 0–32)
AST: 19 IU/L (ref 0–40)
Albumin: 4.4 g/dL (ref 3.9–4.9)
Alkaline Phosphatase: 115 IU/L (ref 44–121)
BUN/Creatinine Ratio: 19 (ref 12–28)
BUN: 15 mg/dL (ref 8–27)
Bilirubin Total: 0.3 mg/dL (ref 0.0–1.2)
CO2: 27 mmol/L (ref 20–29)
Calcium: 9.3 mg/dL (ref 8.7–10.3)
Chloride: 94 mmol/L — ABNORMAL LOW (ref 96–106)
Creatinine, Ser: 0.79 mg/dL (ref 0.57–1.00)
Globulin, Total: 2.6 g/dL (ref 1.5–4.5)
Glucose: 59 mg/dL — ABNORMAL LOW (ref 70–99)
Potassium: 2.8 mmol/L — ABNORMAL LOW (ref 3.5–5.2)
Sodium: 139 mmol/L (ref 134–144)
Total Protein: 7 g/dL (ref 6.0–8.5)
eGFR: 81 mL/min/{1.73_m2} (ref >59)

## 2024-01-27 LAB — URINALYSIS, ROUTINE W REFLEX MICROSCOPIC
Bilirubin, UA: NEGATIVE
Glucose, UA: NEGATIVE
Ketones, UA: NEGATIVE
Leukocytes,UA: NEGATIVE
Nitrite, UA: NEGATIVE
RBC, UA: NEGATIVE
Specific Gravity, UA: 1.02 (ref 1.005–1.030)
Urobilinogen, Ur: 0.2 mg/dL (ref 0.2–1.0)
pH, UA: 7 (ref 5.0–7.5)

## 2024-01-27 LAB — TSH: TSH: 1.98 u[IU]/mL (ref 0.450–4.500)

## 2024-04-04 ENCOUNTER — Other Ambulatory Visit: Payer: Self-pay | Admitting: Internal Medicine

## 2024-04-04 DIAGNOSIS — I10 Essential (primary) hypertension: Secondary | ICD-10-CM

## 2024-04-06 NOTE — Telephone Encounter (Signed)
 OV 01/27/24 Requested Prescriptions  Pending Prescriptions Disp Refills   hydrochlorothiazide  (HYDRODIURIL ) 50 MG tablet [Pharmacy Med Name: HYDROCHLOROTHIAZIDE  50MG  TABLETS] 90 tablet 1    Sig: TAKE 1 TABLET(50 MG) BY MOUTH DAILY     Cardiovascular: Diuretics - Thiazide Failed - 04/06/2024 10:19 AM      Failed - K in normal range and within 180 days    Potassium  Date Value Ref Range Status  01/26/2024 2.8 (L) 3.5 - 5.2 mmol/L Final         Passed - Cr in normal range and within 180 days    Creatinine, Ser  Date Value Ref Range Status  01/26/2024 0.79 0.57 - 1.00 mg/dL Final         Passed - Na in normal range and within 180 days    Sodium  Date Value Ref Range Status  01/26/2024 139 134 - 144 mmol/L Final         Passed - Last BP in normal range    BP Readings from Last 1 Encounters:  01/26/24 118/72         Passed - Valid encounter within last 6 months    Recent Outpatient Visits           2 months ago Essential hypertension   [place] [facility] at [facility], Leita DEL, MD

## 2024-04-19 ENCOUNTER — Ambulatory Visit: Payer: Medicare HMO | Admitting: Emergency Medicine

## 2024-04-19 VITALS — Ht 65.0 in | Wt 140.0 lb

## 2024-04-19 DIAGNOSIS — Z0000 Encounter for general adult medical examination without abnormal findings: Secondary | ICD-10-CM

## 2024-04-19 NOTE — Progress Notes (Signed)
 Subjective:   Taylor Khan is a 68 y.o. who presents for a Medicare Wellness preventive visit.  As a reminder, Annual Wellness Visits don't include a physical exam, and some assessments may be limited, especially if this visit is performed virtually. We may recommend an in-person follow-up visit with your provider if needed.  Visit Complete: Virtual I connected with  Taylor Khan on 04/19/24 by a audio enabled telemedicine application and verified that I am speaking with the correct person using two identifiers.  Patient Location: Home  Provider Location: Home Office  I discussed the limitations of evaluation and management by telemedicine. The patient expressed understanding and agreed to proceed.  Vital Signs: Because this visit was a virtual/telehealth visit, some criteria may be missing or patient reported. Any vitals not documented were not able to be obtained and vitals that have been documented are patient reported.  VideoDeclined- This patient declined Librarian, academic. Therefore the visit was completed with audio only.  Persons Participating in Visit: Patient.  AWV Questionnaire: No: Patient Medicare AWV questionnaire was not completed prior to this visit.  Cardiac Risk Factors include: advanced age (>64men, >35 women);dyslipidemia;hypertension     Objective:    Today's Vitals   04/19/24 1126  Weight: 140 lb (63.5 kg)  Height: 5' 5 (1.651 m)   Body mass index is 23.3 kg/m.     04/19/2024   11:39 AM 04/14/2023    1:35 PM  Advanced Directives  Does Patient Have a Medical Advance Directive? No No  Would patient like information on creating a medical advance directive? Yes (MAU/Ambulatory/Procedural Areas - Information given) No - Patient declined    Current Medications (verified) Outpatient Encounter Medications as of 04/19/2024  Medication Sig   Ascorbic Acid (VITAMIN C PO) Take by mouth. (Patient taking differently: Take by mouth.  PRN)   aspirin EC 325 MG tablet Take 325 mg by mouth daily.   hydrochlorothiazide  (HYDRODIURIL ) 50 MG tablet TAKE 1 TABLET(50 MG) BY MOUTH DAILY   ibuprofen  (ADVIL ) 600 MG tablet Take 1 tablet (600 mg total) by mouth daily. (Patient taking differently: Take 600 mg by mouth daily. PRN)   MAGNESIUM PO Take by mouth.   oxybutynin  (DITROPAN -XL) 5 MG 24 hr tablet TAKE 1 TABLET(5 MG) BY MOUTH AT BEDTIME   potassium chloride  SA (KLOR-CON  M) 20 MEQ tablet TAKE 1 TABLET(20 MEQ) BY MOUTH DAILY   triamcinolone (NASACORT ALLERGY 24HR) 55 MCG/ACT AERO nasal inhaler Place 2 sprays into the nose daily. (Patient not taking: Reported on 04/19/2024)   VITAMIN D PO Take by mouth. (Patient not taking: Reported on 04/19/2024)   No facility-administered encounter medications on file as of 04/19/2024.    Allergies (verified) Patient has no known allergies.   History: Past Medical History:  Diagnosis Date   Anxiety    Hypertension    Past Surgical History:  Procedure Laterality Date   TONSILLECTOMY     Family History  Problem Relation Age of Onset   Hypertension Mother    Heart disease Mother    Stroke Father    Heart disease Father    Hypertension Father    Diabetes Paternal Grandfather    Social History   Socioeconomic History   Marital status: Divorced    Spouse name: Not on file   Number of children: 1   Years of education: Not on file   Highest education level: Not on file  Occupational History   Occupation: Retired  Tobacco Use  Smoking status: Former    Current packs/day: 0.00    Average packs/day: 1 pack/day for 35.0 years (35.0 ttl pk-yrs)    Types: Cigarettes    Start date: 11/07/1984    Quit date: 11/08/2019    Years since quitting: 4.4   Smokeless tobacco: Never   Tobacco comments:    04/19/24 restarted vaping ~6 weeks ago when daughter got very sick/pbt  Vaping Use   Vaping status: Every Day   Substances: Nicotine, Flavoring  Substance and Sexual Activity   Alcohol use: Never    Drug use: Never   Sexual activity: Not on file  Other Topics Concern   Not on file  Social History Narrative   Not on file   Social Drivers of Health   Financial Resource Strain: Low Risk  (04/19/2024)   Overall Financial Resource Strain (CARDIA)    Difficulty of Paying Living Expenses: Not very hard  Food Insecurity: No Food Insecurity (04/19/2024)   Hunger Vital Sign    Worried About Running Out of Food in the Last Year: Never true    Ran Out of Food in the Last Year: Never true  Transportation Needs: No Transportation Needs (04/19/2024)   PRAPARE - Administrator, Civil Service (Medical): No    Lack of Transportation (Non-Medical): No  Physical Activity: Inactive (04/19/2024)   Exercise Vital Sign    Days of Exercise per Week: 0 days    Minutes of Exercise per Session: 0 min  Stress: No Stress Concern Present (04/19/2024)   [employer] of Occupational Health - Occupational Stress Questionnaire    Feeling of Stress: Not at all  Social Connections: Socially Isolated (04/19/2024)   Social Connection and Isolation Panel    Frequency of Communication with Friends and Family: More than three times a week    Frequency of Social Gatherings with Friends and Family: More than three times a week    Attends Religious Services: Never    Database administrator or Organizations: No    Attends Banker Meetings: Never    Marital Status: Divorced    Tobacco Counseling Counseling given: Not Answered Tobacco comments: 04/19/24 restarted vaping ~6 weeks ago when daughter got very sick/pbt    Clinical Intake:  Pre-visit preparation completed: Yes  Pain : No/denies pain     BMI - recorded: 23.3 Nutritional Status: BMI of 19-24  Normal Nutritional Risks: None Diabetes: No  Lab Results  Component Value Date   HGBA1C 5.7 12/26/2019     How often do you need to have someone help you when you read instructions, pamphlets, or other written materials from your  doctor or pharmacy?: 1 - Never  Interpreter Needed?: No  Information entered by :: Vina Ned, CMA   Activities of Daily Living     04/19/2024   11:27 AM  In your present state of health, do you have any difficulty performing the following activities:  Hearing? 0  Vision? 0  Difficulty concentrating or making decisions? 0  Walking or climbing stairs? 0  Dressing or bathing? 0  Doing errands, shopping? 1  Comment doesn't drive, daughter takes to appointments  Preparing Food and eating ? N  Using the Toilet? N  In the past six months, have you accidently leaked urine? Y  Comment wears depends  Do you have problems with loss of bowel control? N  Managing your Medications? N  Managing your Finances? N  Housekeeping or managing your Housekeeping? N  Patient Care Team: Justus Leita DEL, MD as PCP - General (Internal Medicine) [facility] (Optometry)  I have updated your Care Teams any recent Medical Services you may have received from other providers in the past year.     Assessment:   This is a routine wellness examination for Taylor Khan.  Hearing/Vision screen Hearing Screening - Comments:: Denies hearing loss  Vision Screening - Comments:: Needs routine eye exam, [facility],  [place]   Goals Addressed               This Visit's Progress     Increase physical activity (pt-stated)         Depression Screen     04/19/2024   11:36 AM 01/26/2024    2:28 PM 04/14/2023    1:34 PM 02/16/2023   10:46 AM 08/14/2022    2:12 PM 04/08/2022    1:52 PM 02/10/2022    2:46 PM  PHQ 2/9 Scores  PHQ - 2 Score 0 0 0 2 3 2 2   PHQ- 9 Score 2 0 0 17 10 9 8     Fall Risk     04/19/2024   11:41 AM 01/26/2024    2:28 PM 04/14/2023    1:36 PM 02/16/2023   10:45 AM 08/14/2022    2:14 PM  Fall Risk   Falls in the past year? 0 0 1 0 0  Number falls in past yr: 0 0 0 0 0  Injury with Fall? 0 0 0 0 0  Risk for fall due to : No Fall Risks No Fall Risks  History of fall(s) No Fall Risks No Fall Risks  Follow up Falls evaluation completed Falls evaluation completed Falls prevention discussed;Falls evaluation completed Falls evaluation completed Falls evaluation completed      Data saved with a previous flowsheet row definition    MEDICARE RISK AT HOME:  Medicare Risk at Home Any stairs in or around the home?: Yes If so, are there any without handrails?: No Home free of loose throw rugs in walkways, pet beds, electrical cords, etc?: Yes Adequate lighting in your home to reduce risk of falls?: Yes Life alert?: No Use of a cane, walker or w/c?: No Grab bars in the bathroom?: Yes Shower chair or bench in shower?: No Elevated toilet seat or a handicapped toilet?: No  TIMED UP AND GO:  Was the test performed?  No  Cognitive Function: 6CIT completed        04/19/2024   11:42 AM 04/14/2023    1:37 PM 04/08/2022    1:55 PM  6CIT Screen  What Year? 0 points 0 points 0 points  What month? 0 points 0 points 0 points  What time? 0 points 0 points 0 points  Count back from 20 0 points 0 points 0 points  Months in reverse 0 points 0 points 0 points  Repeat phrase 0 points 0 points 0 points  Total Score 0 points 0 points 0 points    Immunizations Immunization History  Administered Date(s) Administered   PNEUMOCOCCAL CONJUGATE-20 02/16/2023    Screening Tests Health Maintenance  Topic Date Due   DTaP/Tdap/Td (1 - Tdap) Never done   Zoster Vaccines- Shingrix (1 of 2) Never done   Lung Cancer Screening  01/01/2021   COVID-19 Vaccine (1 - 2024-25 season) Never done   MAMMOGRAM  06/15/2024 (Originally 04/15/2004)   DEXA SCAN  06/15/2024 (Originally 04/16/2019)   Colonoscopy  06/15/2024 (Originally 04/16/1999)   INFLUENZA VACCINE  04/14/2025 (Originally 04/14/2024)   COLON CANCER SCREENING ANNUAL FOBT  06/20/2024   Medicare Annual Wellness (AWV)  04/19/2025   Pneumococcal Vaccine: 50+ Years  Completed   Hepatitis C Screening  Completed    Hepatitis B Vaccines  Aged Out   HPV VACCINES  Aged Out   Meningococcal B Vaccine  Aged Out    Health Maintenance  Health Maintenance Due  Topic Date Due   DTaP/Tdap/Td (1 - Tdap) Never done   Zoster Vaccines- Shingrix (1 of 2) Never done   Lung Cancer Screening  01/01/2021   COVID-19 Vaccine (1 - 2024-25 season) Never done   Health Maintenance Items Addressed: See Nurse Notes at the end of this note  Additional Screening:  Vision Screening: Recommended annual ophthalmology exams for early detection of glaucoma and other disorders of the eye. Would you like a referral to an eye doctor? No    Dental Screening: Recommended annual dental exams for proper oral hygiene  Community Resource Referral / Chronic Care Management: CRR required this visit?  No   CCM required this visit?  No   Plan:    I have personally reviewed and noted the following in the patient's chart:   Medical and social history Use of alcohol, tobacco or illicit drugs  Current medications and supplements including opioid prescriptions. Patient is not currently taking opioid prescriptions. Functional ability and status Nutritional status Physical activity Advanced directives List of other physicians Hospitalizations, surgeries, and ER visits in previous 12 months Vitals Screenings to include cognitive, depression, and falls Referrals and appointments  In addition, I have reviewed and discussed with patient certain preventive protocols, quality metrics, and best practice recommendations. A written personalized care plan for preventive services as well as general preventive health recommendations were provided to patient.   Vina Ned, CMA   04/19/2024   After Visit Summary: (MyChart) Due to this being a telephonic visit, the after visit summary with patients personalized plan was offered to patient via MyChart   Notes:  Needs routine eye exam. Patient to call and schedule. LDCT scan ordered  09/10/23 by pulmonology Declined flu, Tdap and covid vaccines Shingles UTD per patient Declined MMG and DEXA Declined colonoscopy (FIT test yearly)

## 2024-04-19 NOTE — Patient Instructions (Signed)
 Ms. Taylor Khan , Thank you for taking time out of your busy schedule to complete your Annual Wellness Visit with me. I enjoyed our conversation and look forward to speaking with you again next year. I, as well as your care team,  appreciate your ongoing commitment to your health goals. Please review the following plan we discussed and let me know if I can assist you in the future. Your Game plan/ To Do List    Referrals: None   Follow up Visits: We will see or speak with you next year for your Next Medicare AWV with our clinical staff Have you seen your provider in the last 6 months (3 months if uncontrolled diabetes)? Yes  Clinician Recommendations: Recommend getting a routine eye exam every 2 years. Call and schedule eye exam at your earliest convenience. Aim for 30 minutes of exercise or brisk walking, 6-8 glasses of water, and 5 servings of fruits and vegetables each day.       This is a list of the screenings recommended for you:  Health Maintenance  Topic Date Due   DTaP/Tdap/Td vaccine (1 - Tdap) Never done   Zoster (Shingles) Vaccine (1 of 2) Never done   Screening for Lung Cancer  01/01/2021   COVID-19 Vaccine (1 - 2024-25 season) Never done   Mammogram  06/15/2024*   DEXA scan (bone density measurement)  06/15/2024*   Colon Cancer Screening  06/15/2024*   Flu Shot  04/14/2025*   Stool Blood Test  06/20/2024   Medicare Annual Wellness Visit  04/19/2025   Pneumococcal Vaccine for age over 75  Completed   Hepatitis C Screening  Completed   Hepatitis B Vaccine  Aged Out   HPV Vaccine  Aged Out   Meningitis B Vaccine  Aged Out  *Topic was postponed. The date shown is not the original due date.    Advanced directives: (ACP Link)Information on Advanced Care Planning can be found at [place]  Secretary of [facility] Advance Health Care Directives Advance Health Care Directives. [email or web address] You may also get the forms at your doctor's office. Advance Care Planning is important because  it:  [x]  Makes sure you receive the medical care that is consistent with your values, goals, and preferences  [x]  It provides guidance to your family and loved ones and reduces their decisional burden about whether or not they are making the right decisions based on your wishes.  Follow the link provided in your after visit summary or read over the paperwork we have mailed to you to help you started getting your Advance Directives in place. If you need assistance in completing these, please reach out to us  so that we can help you!  See attachments for Preventive Care and Fall Prevention Tips.   Fall Prevention in the Home, Adult Falls can cause injuries and affect people of all ages. There are many simple things that you can do to make your home safe and to help prevent falls. If you need it, ask for help making these changes. What actions can I take to prevent falls? General information Use good lighting in all rooms. Make sure to: Replace any light bulbs that burn out. Turn on lights if it is dark and use night-lights. Keep items that you use often in easy-to-reach places. Lower the shelves around your home if needed. Move furniture so that there are clear paths around it. Do not keep throw rugs or other things on the floor that can make you trip. If any of  your floors are uneven, fix them. Add color or contrast paint or tape to clearly mark and help you see: Grab bars or handrails. First and last steps of staircases. Where the edge of each step is. If you use a ladder or stepladder: Make sure that it is fully opened. Do not climb a closed ladder. Make sure the sides of the ladder are locked in place. Have someone hold the ladder while you use it. Know where your pets are as you move through your home. What can I do in the bathroom?     Keep the floor dry. Clean up any water that is on the floor right away. Remove soap buildup in the bathtub or shower. Buildup makes bathtubs and  showers slippery. Use non-skid mats or decals on the floor of the bathtub or shower. Attach bath mats securely with double-sided, non-slip rug tape. If you need to sit down while you are in the shower, use a non-slip stool. Install grab bars by the toilet and in the bathtub and shower. Do not use towel bars as grab bars. What can I do in the bedroom? Make sure that you have a light by your bed that is easy to reach. Do not use any sheets or blankets on your bed that hang to the floor. Have a firm bench or chair with side arms that you can use for support when you get dressed. What can I do in the kitchen? Clean up any spills right away. If you need to reach something above you, use a sturdy step stool that has a grab bar. Keep electrical cables out of the way. Do not use floor polish or wax that makes floors slippery. What can I do with my stairs? Do not leave anything on the stairs. Make sure that you have a light switch at the top and the bottom of the stairs. Have them installed if you do not have them. Make sure that there are handrails on both sides of the stairs. Fix handrails that are broken or loose. Make sure that handrails are as long as the staircases. Install non-slip stair treads on all stairs in your home if they do not have carpet. Avoid having throw rugs at the top or bottom of stairs, or secure the rugs with carpet tape to prevent them from moving. Choose a carpet design that does not hide the edge of steps on the stairs. Make sure that carpet is firmly attached to the stairs. Fix any carpet that is loose or worn. What can I do on the outside of my home? Use bright outdoor lighting. Repair the edges of walkways and driveways and fix any cracks. Clear paths of anything that can make you trip, such as tools or rocks. Add color or contrast paint or tape to clearly mark and help you see high doorway thresholds. Trim any bushes or trees on the main path into your home. Check  that handrails are securely fastened and in good repair. Both sides of all steps should have handrails. Install guardrails along the edges of any raised decks or porches. Have leaves, snow, and ice cleared regularly. Use sand, salt, or ice melt on walkways during winter months if you live where there is ice and snow. In the garage, clean up any spills right away, including grease or oil spills. What other actions can I take? Review your medicines with your health care provider. Some medicines can make you confused or feel dizzy. This can increase your chance  of falling. Wear closed-toe shoes that fit well and support your feet. Wear shoes that have rubber soles and low heels. Use a cane, walker, scooter, or crutches that help you move around if needed. Talk with your provider about other ways that you can decrease your risk of falls. This may include seeing a physical therapist to learn to do exercises to improve movement and strength. Where to find more information Centers for Disease Control and Prevention, STEADI: TonerPromos.no [employer] on Aging: BaseRingTones.pl National Institute on Aging: BaseRingTones.pl Contact a health care provider if: You are afraid of falling at home. You feel weak, drowsy, or dizzy at home. You fall at home. Get help right away if you: Lose consciousness or have trouble moving after a fall. Have a fall that causes a head injury. These symptoms may be an emergency. Get help right away. Call 911. Do not wait to see if the symptoms will go away. Do not drive yourself to the hospital. This information is not intended to replace advice given to you by your health care provider. Make sure you discuss any questions you have with your health care provider. Document Revised: 05/04/2022 Document Reviewed: 05/04/2022 Elsevier Patient Education  2024 [employer].

## 2024-07-05 ENCOUNTER — Other Ambulatory Visit: Payer: Self-pay | Admitting: Internal Medicine

## 2024-07-05 DIAGNOSIS — I10 Essential (primary) hypertension: Secondary | ICD-10-CM

## 2024-07-06 NOTE — Telephone Encounter (Signed)
 Requested by interface surescripts. Future visit 07/18/24.  Requested Prescriptions  Pending Prescriptions Disp Refills   hydrochlorothiazide  (HYDRODIURIL ) 50 MG tablet [Pharmacy Med Name: HYDROCHLOROTHIAZIDE  50MG  TABLETS] 90 tablet 0    Sig: TAKE 1 TABLET(50 MG) BY MOUTH DAILY     Cardiovascular: Diuretics - Thiazide Failed - 07/06/2024  4:28 PM      Failed - K in normal range and within 180 days    Potassium  Date Value Ref Range Status  01/26/2024 2.8 (L) 3.5 - 5.2 mmol/L Final         Passed - Cr in normal range and within 180 days    Creatinine, Ser  Date Value Ref Range Status  01/26/2024 0.79 0.57 - 1.00 mg/dL Final         Passed - Na in normal range and within 180 days    Sodium  Date Value Ref Range Status  01/26/2024 139 134 - 144 mmol/L Final         Passed - Last BP in normal range    BP Readings from Last 1 Encounters:  01/26/24 118/72         Passed - Valid encounter within last 6 months    Recent Outpatient Visits           5 months ago Essential hypertension   [facility] at [facility], Leita DEL, MD

## 2024-07-13 ENCOUNTER — Other Ambulatory Visit: Payer: Self-pay | Admitting: Internal Medicine

## 2024-07-13 DIAGNOSIS — E876 Hypokalemia: Secondary | ICD-10-CM

## 2024-07-14 NOTE — Telephone Encounter (Signed)
 Requested Prescriptions  Pending Prescriptions Disp Refills   potassium chloride  SA (KLOR-CON  M) 20 MEQ tablet [Pharmacy Med Name: POTASSIUM CL ER TABLETS] 90 tablet 1    Sig: TAKE 1 TABLET(20 MEQ) BY MOUTH DAILY     Endocrinology:  Minerals - Potassium Supplementation Failed - 07/14/2024  2:14 PM      Failed - K in normal range and within 360 days    Potassium  Date Value Ref Range Status  01/26/2024 2.8 (L) 3.5 - 5.2 mmol/L Final         Passed - Cr in normal range and within 360 days    Creatinine, Ser  Date Value Ref Range Status  01/26/2024 0.79 0.57 - 1.00 mg/dL Final         Passed - Valid encounter within last 12 months    Recent Outpatient Visits           5 months ago Essential hypertension   [facility] at [facility], Leita DEL, MD

## 2024-07-18 ENCOUNTER — Encounter: Payer: Self-pay | Admitting: Internal Medicine

## 2024-07-18 ENCOUNTER — Ambulatory Visit (INDEPENDENT_AMBULATORY_CARE_PROVIDER_SITE_OTHER): Admitting: Internal Medicine

## 2024-07-18 VITALS — BP 122/66 | HR 71 | Ht 65.0 in | Wt 147.0 lb

## 2024-07-18 DIAGNOSIS — I10 Essential (primary) hypertension: Secondary | ICD-10-CM

## 2024-07-18 DIAGNOSIS — N3281 Overactive bladder: Secondary | ICD-10-CM | POA: Diagnosis not present

## 2024-07-18 DIAGNOSIS — M545 Low back pain, unspecified: Secondary | ICD-10-CM

## 2024-07-18 DIAGNOSIS — J3489 Other specified disorders of nose and nasal sinuses: Secondary | ICD-10-CM | POA: Diagnosis not present

## 2024-07-18 MED ORDER — IBUPROFEN 600 MG PO TABS: 600.0000 mg | ORAL_TABLET | Freq: Two times a day (BID) | ORAL | 1 refills | Status: AC | PRN

## 2024-07-18 NOTE — Assessment & Plan Note (Signed)
 Well controlled blood pressure today. Current regimen is hydrochlorothiazide . No medication side effects noted.

## 2024-07-18 NOTE — Progress Notes (Signed)
 Date:  07/18/2024   Name:  Taylor Khan   DOB:  04/28/1956   MRN:  [id number]   Chief Complaint: Hypertension  Hypertension This is a chronic problem. The problem is controlled. Pertinent negatives include no chest pain, headaches, palpitations or shortness of breath. Past treatments include diuretics.    Review of Systems  Constitutional:  Negative for fatigue and unexpected weight change.  HENT:  Positive for congestion and sinus pressure. Negative for postnasal drip, sinus pain and trouble swallowing.   Eyes:  Negative for visual disturbance.  Respiratory:  Negative for cough, chest tightness, shortness of breath and wheezing.   Cardiovascular:  Negative for chest pain, palpitations and leg swelling.  Gastrointestinal:  Negative for abdominal pain, constipation and diarrhea.  Musculoskeletal:  Positive for back pain. Negative for arthralgias and myalgias.  Neurological:  Negative for dizziness, weakness, light-headedness and headaches.  Psychiatric/Behavioral:  Negative for dysphoric mood and sleep disturbance. The patient is not nervous/anxious.      Lab Results  Component Value Date   NA 139 01/26/2024   K 2.8 (L) 01/26/2024   CO2 27 01/26/2024   GLUCOSE 59 (L) 01/26/2024   BUN 15 01/26/2024   CREATININE 0.79 01/26/2024   CALCIUM 9.3 01/26/2024   EGFR 81 01/26/2024   Lab Results  Component Value Date   CHOL 204 (H) 02/16/2023   HDL 75 02/16/2023   LDLCALC 105 (H) 02/16/2023   TRIG 137 02/16/2023   CHOLHDL 2.7 02/16/2023   Lab Results  Component Value Date   TSH 1.980 01/26/2024   Lab Results  Component Value Date   HGBA1C 5.7 12/26/2019   Lab Results  Component Value Date   WBC 8.3 01/26/2024   HGB 15.4 01/26/2024   HCT 47.5 (H) 01/26/2024   MCV 88 01/26/2024   PLT 347 01/26/2024   Lab Results  Component Value Date   ALT 13 01/26/2024   AST 19 01/26/2024   ALKPHOS 115 01/26/2024   BILITOT 0.3 01/26/2024   No results found for: MARIEN BOLLS, VD25OH   Patient Active Problem List   Diagnosis Date Noted   Mixed hyperlipidemia 02/16/2023   Lumbar back pain 02/16/2023   Bilateral hearing loss 02/16/2023   Dysphagia 02/16/2023   Essential hypertension 01/09/2022   Centrilobular emphysema (HCC) 01/09/2022   Adrenal nodule 01/09/2022   Environmental and seasonal allergies 01/09/2022   OAB (overactive bladder) 01/09/2022   Tobacco use disorder, severe, in early remission 01/09/2022    No Known Allergies  Past Surgical History:  Procedure Laterality Date   TONSILLECTOMY      Social History   Tobacco Use   Smoking status: Former    Current packs/day: 0.00    Average packs/day: 1 pack/day for 35.0 years (35.0 ttl pk-yrs)    Types: Cigarettes    Start date: 11/07/1984    Quit date: 11/08/2019    Years since quitting: 4.6   Smokeless tobacco: Never   Tobacco comments:    04/19/24 restarted vaping ~6 weeks ago when daughter got very sick/pbt  Vaping Use   Vaping status: Every Day   Substances: Nicotine, Flavoring  Substance Use Topics   Alcohol use: Never   Drug use: Never     Medication list has been reviewed and updated.  Current Meds  Medication Sig   Ascorbic Acid (VITAMIN C PO) Take by mouth. (Patient taking differently: Take by mouth. PRN)   aspirin EC 325 MG tablet Take 325 mg by mouth daily.  hydrochlorothiazide  (HYDRODIURIL ) 50 MG tablet TAKE 1 TABLET(50 MG) BY MOUTH DAILY   MAGNESIUM PO Take by mouth.   oxybutynin  (DITROPAN -XL) 5 MG 24 hr tablet TAKE 1 TABLET(5 MG) BY MOUTH AT BEDTIME   potassium chloride  SA (KLOR-CON  M) 20 MEQ tablet TAKE 1 TABLET(20 MEQ) BY MOUTH DAILY   triamcinolone (NASACORT ALLERGY 24HR) 55 MCG/ACT AERO nasal inhaler Place 2 sprays into the nose daily.   VITAMIN D PO Take by mouth.   [DISCONTINUED] ibuprofen  (ADVIL ) 600 MG tablet Take 1 tablet (600 mg total) by mouth daily. (Patient taking differently: Take 600 mg by mouth daily. PRN)       07/18/2024    1:54 PM  01/26/2024    2:30 PM 02/16/2023   10:46 AM 08/14/2022    2:13 PM  GAD 7 : Generalized Anxiety Score  Nervous, Anxious, on Edge 0 0 0 0  Control/stop worrying 0 0 0 0  Worry too much - different things 0 0 0 1  Trouble relaxing 0 0 0 0  Restless 0 0 0 0  Easily annoyed or irritable 0 0 0 0  Afraid - awful might happen 0 0 0 0  Total GAD 7 Score 0 0 0 1  Anxiety Difficulty Not difficult at all Not difficult at all Not difficult at all Not difficult at all       07/18/2024    1:54 PM 04/19/2024   11:36 AM 01/26/2024    2:28 PM  Depression screen PHQ 2/9  Decreased Interest 0 0 0  Down, Depressed, Hopeless 0 0 0  PHQ - 2 Score 0 0 0  Altered sleeping 0 1 0  Tired, decreased energy 0 1 0  Change in appetite 0 0 0  Feeling bad or failure about yourself  0 0 0  Trouble concentrating 0 0 0  Moving slowly or fidgety/restless 0 0 0  Suicidal thoughts 0 0 0  PHQ-9 Score 0 2 0  Difficult doing work/chores Not difficult at all Not difficult at all Not difficult at all    BP Readings from Last 3 Encounters:  07/18/24 122/66  01/26/24 118/72  06/16/23 118/74    Physical Exam Vitals and nursing note reviewed.  Constitutional:      General: She is not in acute distress.    Appearance: Normal appearance. She is well-developed.  HENT:     Head: Normocephalic and atraumatic.     Nose:     Right Sinus: Frontal sinus tenderness present. No maxillary sinus tenderness.     Left Sinus: Frontal sinus tenderness present. No maxillary sinus tenderness.  Cardiovascular:     Rate and Rhythm: Normal rate and regular rhythm.  Pulmonary:     Effort: Pulmonary effort is normal. No respiratory distress.     Breath sounds: No wheezing or rhonchi.  Musculoskeletal:     Cervical back: Normal range of motion.     Right lower leg: No edema.     Left lower leg: No edema.  Lymphadenopathy:     Cervical: No cervical adenopathy.  Skin:    General: Skin is warm and dry.     Findings: No rash.   Neurological:     General: No focal deficit present.     Mental Status: She is alert and oriented to person, place, and time.  Psychiatric:        Mood and Affect: Mood normal.        Behavior: Behavior normal.     Wt Readings  from Last 3 Encounters:  07/18/24 147 lb (66.7 kg)  04/19/24 140 lb (63.5 kg)  01/26/24 145 lb 2 oz (65.8 kg)    BP 122/66   Pulse 71   Ht 5' 5 (1.651 m)   Wt 147 lb (66.7 kg)   SpO2 92%   BMI 24.46 kg/m   Assessment and Plan:  Problem List Items Addressed This Visit       Unprioritized   Essential hypertension - Primary (Chronic)   Well controlled blood pressure today. Current regimen is hydrochlorothiazide .. No medication side effects noted.        OAB (overactive bladder) (Chronic)   On Ditropan  daily and doing well. No urinary tract infection symptoms noted. Minimal dry mouth and no other side effects.      Lumbar back pain   She continues to take Advil  daily as needed. Renal function and hepatic labs are normal.      Relevant Medications   ibuprofen  (ADVIL ) 600 MG tablet   Other Visit Diagnoses       Sinus pressure       Recommend daily use of Nasocort or Flonase No evidence of infection at this time      She is planning to transfer care closer to home in [place].  CPX due in May; she continues to decline MM, CRC screening.  No follow-ups on file.    Leita HILARIO Adie, MD [facility] [place]

## 2024-07-18 NOTE — Assessment & Plan Note (Signed)
 She continues to take Advil  daily as needed. Renal function and hepatic labs are normal.

## 2024-07-18 NOTE — Assessment & Plan Note (Signed)
 On Ditropan  daily and doing well. No urinary tract infection symptoms noted. Minimal dry mouth and no other side effects.

## 2024-09-05 ENCOUNTER — Other Ambulatory Visit: Payer: Self-pay | Admitting: Internal Medicine

## 2024-09-05 DIAGNOSIS — N3281 Overactive bladder: Secondary | ICD-10-CM

## 2024-09-06 NOTE — Telephone Encounter (Signed)
 Needs OV scheduled for more refills.

## 2024-09-06 NOTE — Telephone Encounter (Signed)
 Requested by interface surescripts. Will need OV for further refills.  Requested Prescriptions  Pending Prescriptions Disp Refills   oxybutynin  (DITROPAN -XL) 5 MG 24 hr tablet [Pharmacy Med Name: OXYBUTYNIN  ER 5MG  TABLETS] 90 tablet 0    Sig: TAKE 1 TABLET(5 MG) BY MOUTH AT BEDTIME     Urology:  Bladder Agents Passed - 09/06/2024  2:23 PM      Passed - Valid encounter within last 12 months    Recent Outpatient Visits           1 month ago Essential hypertension   [facility] at [facility], Leita DEL, MD   7 months ago Essential hypertension   [facility] at [facility], Leita DEL, MD

## 2025-04-25 ENCOUNTER — Ambulatory Visit
